# Patient Record
Sex: Female | Born: 1985
Health system: Southern US, Community
[De-identification: ages and names within clinical notes are randomized; demographics above are authoritative.]

## PROBLEM LIST (undated history)

## (undated) DIAGNOSIS — F32A Depression, unspecified: Secondary | ICD-10-CM

## (undated) DIAGNOSIS — K5900 Constipation, unspecified: Secondary | ICD-10-CM

## (undated) DIAGNOSIS — F419 Anxiety disorder, unspecified: Secondary | ICD-10-CM

## (undated) DIAGNOSIS — F329 Major depressive disorder, single episode, unspecified: Secondary | ICD-10-CM

## (undated) HISTORY — DX: Depression, unspecified: F32.A

## (undated) HISTORY — DX: Anxiety disorder, unspecified: F41.9

## (undated) HISTORY — DX: Major depressive disorder, single episode, unspecified: F32.9

---
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
## 2015-03-18 ENCOUNTER — Ambulatory Visit: Payer: Self-pay | Admitting: Nurse Practitioner

## 2015-03-24 ENCOUNTER — Encounter: Payer: Self-pay | Admitting: Nurse Practitioner

## 2015-03-24 ENCOUNTER — Encounter (INDEPENDENT_AMBULATORY_CARE_PROVIDER_SITE_OTHER): Payer: Self-pay

## 2015-03-24 ENCOUNTER — Ambulatory Visit (INDEPENDENT_AMBULATORY_CARE_PROVIDER_SITE_OTHER): Payer: BLUE CROSS/BLUE SHIELD | Admitting: Nurse Practitioner

## 2015-03-24 VITALS — BP 118/78 | HR 95 | Temp 98.5°F | Resp 14 | Ht 65.0 in | Wt 132.0 lb

## 2015-03-24 DIAGNOSIS — Z7189 Other specified counseling: Secondary | ICD-10-CM | POA: Diagnosis not present

## 2015-03-24 DIAGNOSIS — F411 Generalized anxiety disorder: Secondary | ICD-10-CM

## 2015-03-24 DIAGNOSIS — Z7251 High risk heterosexual behavior: Secondary | ICD-10-CM

## 2015-03-24 DIAGNOSIS — Z7689 Persons encountering health services in other specified circumstances: Secondary | ICD-10-CM | POA: Insufficient documentation

## 2015-03-24 MED ORDER — BUSPIRONE HCL 7.5 MG PO TABS: 7.5000 mg | ORAL_TABLET | Freq: Three times a day (TID) | ORAL | Status: DC

## 2015-03-24 NOTE — Assessment & Plan Note (Signed)
Discussed acute and chronic issues. Reviewed health maintenance measures, PFSHx, and immunizations. Obtain records from previous facility.   

## 2015-03-24 NOTE — Patient Instructions (Addendum)
Welcome to [facility]!  Generalized Anxiety Disorder Generalized anxiety disorder (GAD) is a mental disorder. It interferes with life functions, including relationships, work, and school. GAD is different from normal anxiety, which everyone experiences at some point in their lives in response to specific life events and activities. Normal anxiety actually helps [place] prepare for and get through these life events and activities. Normal anxiety goes away after the event or activity is over.  GAD causes anxiety that is not necessarily related to specific events or activities. It also causes excess anxiety in proportion to specific events or activities. The anxiety associated with GAD is also difficult to control. GAD can vary from mild to severe. People with severe GAD can have intense waves of anxiety with physical symptoms (panic attacks).  SYMPTOMS The anxiety and worry associated with GAD are difficult to control. This anxiety and worry are related to many life events and activities and also occur more days than not for 6 months or longer. People with GAD also have three or more of the following symptoms (one or more in children):  Restlessness.   Fatigue.  Difficulty concentrating.   Irritability.  Muscle tension.  Difficulty sleeping or unsatisfying sleep. DIAGNOSIS GAD is diagnosed through an assessment by your health care provider. Your health care provider will ask you questions aboutyour mood,physical symptoms, and events in your life. Your health care provider may ask you about your medical history and use of alcohol or drugs, including prescription medicines. Your health care provider may also do a physical exam and blood tests. Certain medical conditions and the use of certain substances can cause symptoms similar to those associated with GAD. Your health care provider may refer you to a mental health specialist for further evaluation. TREATMENT The following therapies are usually used  to treat GAD:   Medication. Antidepressant medication usually is prescribed for long-term daily control. Antianxiety medicines may be added in severe cases, especially when panic attacks occur.   Talk therapy (psychotherapy). Certain types of talk therapy can be helpful in treating GAD by providing support, education, and guidance. A form of talk therapy called cognitive behavioral therapy can teach you healthy ways to think about and react to daily life events and activities.  Stress managementtechniques. These include yoga, meditation, and exercise and can be very helpful when they are practiced regularly. A mental health specialist can help determine which treatment is best for you. Some people see improvement with one therapy. However, other people require a combination of therapies. Document Released: 11/19/2012 Document Revised: 12/09/2013 Document Reviewed: 11/19/2012 [facility] Patient Information 2015 [place], [place]. This information is not intended to replace advice given to you by your health care provider. Make sure you discuss any questions you have with your health care provider.

## 2015-03-24 NOTE — Progress Notes (Signed)
Patient ID: Stephanie Williams, female    DOB: 12-04-1985  Age: 29 y.o. MRN: [id number]  CC: Establish Care   HPI Stephanie Williams presents for establishing care and CC of anxiety.   1) New pt info:   Pap- 2014   Eye Exam- 07/2014   LMP- End of July approx.   2) Chronic Problems-  Anxiety and depression- see acute  3) Acute Problems-  Anxiety- overwhelming feelings, cluttered mind, tachycardia. Patient reports she is unable to turn her mind off at nighttime. She reports she has had an affair that is unknown to her husband. She would like to have STD testing today.    History Stephanie Williams has a past medical history of Anxiety and Depression.   She has past surgical history that includes Wisdom tooth extraction (2001).   Her family history includes Cancer in her maternal grandfather and paternal grandfather; Hypertension in her father and maternal grandmother.She reports that she has never smoked. She has never used smokeless tobacco. She reports that she drinks about 1.8 oz of alcohol per week. She reports that she does not use illicit drugs.  No outpatient prescriptions prior to visit.   No facility-administered medications prior to visit.    ROS Review of Systems  Constitutional: Negative for fever, chills, diaphoresis and fatigue.  Respiratory: Negative for chest tightness, shortness of breath and wheezing.   Cardiovascular: Negative for chest pain, palpitations and leg swelling.  Gastrointestinal: Negative for nausea, vomiting and diarrhea.  Skin: Negative for rash.  Neurological: Negative for dizziness, weakness, numbness and headaches.  Psychiatric/Behavioral: The patient is nervous/anxious.     Objective:  BP 118/78 mmHg  Pulse 95  Temp(Src) 98.5 F (36.9 C)  Resp 14  Ht 5\' 5"  (1.651 m)  Wt 132 lb (59.875 kg)  BMI 21.97 kg/m2  SpO2 99%  Physical Exam  Constitutional: She is oriented to person, place, and time. She appears well-developed and well-nourished. No distress.  HENT:   Head: Normocephalic and atraumatic.  Right Ear: External ear normal.  Left Ear: External ear normal.  Cardiovascular: Normal rate and regular rhythm.   No murmur heard. Pulmonary/Chest: Effort normal and breath sounds normal. No respiratory distress. She has no wheezes. She has no rales. She exhibits no tenderness.  Genitourinary:  Deferred exam today due to lack of symptoms.  Neurological: She is alert and oriented to person, place, and time. No cranial nerve deficit. She exhibits normal muscle tone. Coordination normal.  Skin: Skin is warm and dry. No rash noted. She is not diaphoretic.  Psychiatric: She has a normal mood and affect. Her behavior is normal. Judgment and thought content normal.   Assessment & Plan:   Stephanie Williams was seen today for establish care.  Diagnoses and all orders for this visit:  Generalized anxiety disorder  Encounter to establish care  High risk sexual behavior -     Hepatitis C antibody -     STD Panel (HBSAG,HIV,RPR) -     GC/chlamydia probe amp, urine  Other orders -     busPIRone (BUSPAR) 7.5 MG tablet; Take 1 tablet (7.5 mg total) by mouth 3 (three) times daily.   I am having Stephanie Williams start on busPIRone. I am also having her maintain her citalopram.  Meds ordered this encounter  Medications  . citalopram (CELEXA) 40 MG tablet    Sig: Take 40 mg by mouth daily.  . busPIRone (BUSPAR) 7.5 MG tablet    Sig: Take 1 tablet (7.5 mg total) by mouth 3 (  three) times daily.    Dispense:  90 tablet    Refill:  0    Order Specific Question:  Supervising Provider    Answer:  Stephanie Williams [[id number]]     Follow-up: Return in about 4 weeks (around 04/21/2015) for Anxiety.

## 2015-03-25 LAB — GC/CHLAMYDIA PROBE AMP, URINE
Chlamydia, Swab/Urine, PCR: NEGATIVE
GC Probe Amp, Urine: NEGATIVE

## 2015-03-25 LAB — STD PANEL
HEP B S AG: NEGATIVE
HIV 1&2 Ab, 4th Generation: NONREACTIVE

## 2015-03-25 LAB — HEPATITIS C ANTIBODY: HCV AB: NEGATIVE

## 2015-04-01 ENCOUNTER — Encounter: Payer: Self-pay | Admitting: Nurse Practitioner

## 2015-04-01 DIAGNOSIS — Z7251 High risk heterosexual behavior: Secondary | ICD-10-CM | POA: Insufficient documentation

## 2015-04-01 NOTE — Assessment & Plan Note (Addendum)
Patient is currently on Celexa 40 mg daily. Patient willing to try BuSpar 7.5 mg twice a day for 1 week can up to 3 times a day on the second week and beyond. Patient has declined counseling. Follow-up in 4 weeks.

## 2015-04-01 NOTE — Assessment & Plan Note (Signed)
Patient reports having unprotected sex and would like to have STD testing today. Will obtain STD panel, hepatitis C antibody, gonorrhea and chlamydia urine.

## 2015-04-06 ENCOUNTER — Encounter: Payer: BLUE CROSS/BLUE SHIELD | Admitting: Unknown Physician Specialty

## 2015-04-22 ENCOUNTER — Encounter: Payer: Self-pay | Admitting: Nurse Practitioner

## 2015-04-22 ENCOUNTER — Ambulatory Visit (INDEPENDENT_AMBULATORY_CARE_PROVIDER_SITE_OTHER): Payer: BLUE CROSS/BLUE SHIELD | Admitting: Nurse Practitioner

## 2015-04-22 VITALS — BP 116/74 | HR 83 | Temp 99.0°F | Resp 14 | Ht 65.0 in | Wt 133.6 lb

## 2015-04-22 DIAGNOSIS — F411 Generalized anxiety disorder: Secondary | ICD-10-CM | POA: Diagnosis not present

## 2015-04-22 DIAGNOSIS — Z3041 Encounter for surveillance of contraceptive pills: Secondary | ICD-10-CM | POA: Insufficient documentation

## 2015-04-22 MED ORDER — NORGESTIMATE-ETH ESTRADIOL 0.25-35 MG-MCG PO TABS: 1.0000 | ORAL_TABLET | Freq: Every day | ORAL | Status: DC

## 2015-04-22 MED ORDER — CITALOPRAM HYDROBROMIDE 40 MG PO TABS: 40.0000 mg | ORAL_TABLET | Freq: Every day | ORAL | Status: DC

## 2015-04-22 NOTE — Progress Notes (Signed)
Patient ID: Stephanie Williams, female    DOB: 02-16-1986  Age: 29 y.o. MRN: [id number]  CC: Follow-up   HPI [name] presents for follow up of anxiety.   1) Celexa 40 mg daily and tried Buspar 7.5 mg  Improved . Buspar- helps when taking occasionally Patient reports she is currently in counseling with her husband to work on issues. Husband is very willing to move forward with her and she is pleased about this.  LMP- 04/07/2015, 5 days normal for pt   History [name] has a past medical history of Anxiety and Depression.   She has past surgical history that includes Wisdom tooth extraction (2001).   Her family history includes Cancer in her maternal grandfather and paternal grandfather; Hypertension in her father and maternal grandmother.She reports that she has never smoked. She has never used smokeless tobacco. She reports that she drinks about 1.8 oz of alcohol per week. She reports that she does not use illicit drugs.  Outpatient Prescriptions Prior to Visit  Medication Sig Dispense Refill  . busPIRone (BUSPAR) 7.5 MG tablet Take 1 tablet (7.5 mg total) by mouth 3 (three) times daily. 90 tablet 0  . citalopram (CELEXA) 40 MG tablet Take 40 mg by mouth daily.     No facility-administered medications prior to visit.    ROS Review of Systems  Constitutional: Negative for fever, chills, diaphoresis and fatigue.  Respiratory: Negative for chest tightness, shortness of breath and wheezing.   Gastrointestinal: Negative for nausea, vomiting and diarrhea.  Neurological: Negative for dizziness and headaches.  Psychiatric/Behavioral: The patient is nervous/anxious.        Improving    Objective:  BP 116/74 mmHg  Pulse 83  Temp(Src) 99 F (37.2 C)  Resp 14  Ht 5\' 5"  (1.651 m)  Wt 133 lb 9.6 oz (60.601 kg)  BMI 22.23 kg/m2  SpO2 99%  Physical Exam  Constitutional: She is oriented to person, place, and time. She appears well-developed and well-nourished. No distress.  HENT:  Head:  Normocephalic and atraumatic.  Right Ear: External ear normal.  Left Ear: External ear normal.  Neurological: She is alert and oriented to person, place, and time. No cranial nerve deficit. She exhibits normal muscle tone. Coordination normal.  Skin: Skin is warm and dry. No rash noted. She is not diaphoretic.  Psychiatric: She has a normal mood and affect. Her behavior is normal. Judgment and thought content normal.   Assessment & Plan:   Lorita was seen today for follow-up.  Diagnoses and all orders for this visit:  Generalized anxiety disorder  Encounter for birth control pills maintenance  Other orders -     norgestimate-ethinyl estradiol (ORTHO-CYCLEN,SPRINTEC,PREVIFEM) 0.25-35 MG-MCG tablet; Take 1 tablet by mouth daily. -     citalopram (CELEXA) 40 MG tablet; Take 1 tablet (40 mg total) by mouth daily.  I have changed [name]'s citalopram. I am also having her maintain her busPIRone and norgestimate-ethinyl estradiol.  Meds ordered this encounter  Medications  . DISCONTD: norgestimate-ethinyl estradiol (ORTHO-CYCLEN,SPRINTEC,PREVIFEM) 0.25-35 MG-MCG tablet    Sig: Take 1 tablet by mouth daily.  . norgestimate-ethinyl estradiol (ORTHO-CYCLEN,SPRINTEC,PREVIFEM) 0.25-35 MG-MCG tablet    Sig: Take 1 tablet by mouth daily.    Dispense:  3 Package    Refill:  3    Order Specific Question:  Supervising Provider    Answer:  Derrel Nip, TERESA L [[id number]]  . citalopram (CELEXA) 40 MG tablet    Sig: Take 1 tablet (40 mg total) by mouth daily.  Dispense:  90 tablet    Refill:  3    Order Specific Question:  Supervising Provider    Answer:  Crecencio Mc [[id number]]     Follow-up: Return in about 6 months (around 10/20/2015) for CPE.

## 2015-04-22 NOTE — Assessment & Plan Note (Signed)
Patient needed refills on birth control today. Last menstrual cycle was the beginning of this month. Patient has not been sexually active since menstrual cycle. We'll follow-up with Pap smear and physical exam in March 2017.

## 2015-04-22 NOTE — Assessment & Plan Note (Addendum)
Improving. Patient is doing well on the buspirone and Celexa combination will continue this regimen. Patient is working on marital counseling. Refilled Celexa for 1 year. Follow-up in 6 months  *Had discussion with patient regarding possible pregnancy in the future and as discussed Celexa being contraindicated during pregnancy. Patient verbalized agreement

## 2015-04-22 NOTE — Progress Notes (Signed)
Pre visit review using our clinic review tool, if applicable. No additional management support is needed unless otherwise documented below in the visit note. 

## 2015-04-22 NOTE — Patient Instructions (Addendum)
You're doing great! See you in 6 months for physical or sooner if needed for any illness.

## 2015-05-18 ENCOUNTER — Ambulatory Visit (INDEPENDENT_AMBULATORY_CARE_PROVIDER_SITE_OTHER): Payer: BLUE CROSS/BLUE SHIELD | Admitting: Nurse Practitioner

## 2015-05-18 ENCOUNTER — Encounter: Payer: Self-pay | Admitting: Nurse Practitioner

## 2015-05-18 VITALS — BP 122/84 | HR 89 | Temp 98.5°F | Resp 14 | Ht 65.0 in | Wt 135.0 lb

## 2015-05-18 DIAGNOSIS — R300 Dysuria: Secondary | ICD-10-CM | POA: Diagnosis not present

## 2015-05-18 LAB — POCT URINALYSIS DIPSTICK
BILIRUBIN UA: NEGATIVE
GLUCOSE UA: NEGATIVE
KETONES UA: NEGATIVE
Leukocytes, UA: NEGATIVE
Nitrite, UA: NEGATIVE
Protein, UA: NEGATIVE
SPEC GRAV UA: 1.01
UROBILINOGEN UA: 0.2
pH, UA: 7

## 2015-05-18 MED ORDER — CIPROFLOXACIN HCL 250 MG PO TABS: 250.0000 mg | ORAL_TABLET | Freq: Two times a day (BID) | ORAL | Status: DC

## 2015-05-18 NOTE — Progress Notes (Signed)
Patient ID: Stephanie Williams, female    DOB: Jun 25, 1986  Age: 29 y.o. MRN: [id number]  CC: Urinary Tract Infection   HPI [name] presents for UTI symptoms x 5-6 days.   1) Pt reports dysuria, low back pain for 5-6 days. Azo last night with some relief.  Small amount of blood seen a few times, lower abdominal pain.  LMP- 05/04/15 Normal for pt  Denies recent swimming or sexual intercourse.   History [name] has a past medical history of Anxiety and Depression.   She has past surgical history that includes Wisdom tooth extraction (2001).   Her family history includes Cancer in her maternal grandfather and paternal grandfather; Hypertension in her father and maternal grandmother.She reports that she has never smoked. She has never used smokeless tobacco. She reports that she drinks about 1.8 oz of alcohol per week. She reports that she does not use illicit drugs.  Outpatient Prescriptions Prior to Visit  Medication Sig Dispense Refill  . busPIRone (BUSPAR) 7.5 MG tablet Take 1 tablet (7.5 mg total) by mouth 3 (three) times daily. 90 tablet 0  . citalopram (CELEXA) 40 MG tablet Take 1 tablet (40 mg total) by mouth daily. 90 tablet 3  . norgestimate-ethinyl estradiol (ORTHO-CYCLEN,SPRINTEC,PREVIFEM) 0.25-35 MG-MCG tablet Take 1 tablet by mouth daily. 3 Package 3   No facility-administered medications prior to visit.    ROS Review of Systems  Constitutional: Negative for fever, chills, diaphoresis and fatigue.  Respiratory: Negative for chest tightness, shortness of breath and wheezing.   Cardiovascular: Negative for chest pain, palpitations and leg swelling.  Gastrointestinal: Positive for abdominal pain. Negative for nausea, vomiting and diarrhea.       Lower abdominal  Genitourinary: Positive for dysuria, urgency, frequency and flank pain. Negative for hematuria, decreased urine volume, difficulty urinating and pelvic pain.  Musculoskeletal: Positive for back pain.    Objective:  BP  122/84 mmHg  Pulse 89  Temp(Src) 98.5 F (36.9 C)  Resp 14  Ht 5\' 5"  (1.651 m)  Wt 135 lb (61.236 kg)  BMI 22.47 kg/m2  SpO2 99%  Physical Exam  Constitutional: She is oriented to person, place, and time. She appears well-developed and well-nourished. No distress.  HENT:  Head: Normocephalic and atraumatic.  Right Ear: External ear normal.  Left Ear: External ear normal.  Abdominal: There is no CVA tenderness.  Neurological: She is alert and oriented to person, place, and time.  Skin: Skin is warm and dry. No rash noted. She is not diaphoretic.  Psychiatric: She has a normal mood and affect. Her behavior is normal. Judgment and thought content normal.   Assessment & Plan:   Doree was seen today for urinary tract infection.  Diagnoses and all orders for this visit:  Dysuria -     POCT Urinalysis Dipstick -     Urine culture  Other orders -     ciprofloxacin (CIPRO) 250 MG tablet; Take 1 tablet (250 mg total) by mouth 2 (two) times daily.   I am having [name] start on ciprofloxacin. I am also having her maintain her busPIRone, norgestimate-ethinyl estradiol, and citalopram.  Meds ordered this encounter  Medications  . ciprofloxacin (CIPRO) 250 MG tablet    Sig: Take 1 tablet (250 mg total) by mouth 2 (two) times daily.    Dispense:  6 tablet    Refill:  0    Order Specific Question:  Supervising Provider    Answer:  Deborra Medina L [[id number]]  Follow-up: Return if symptoms worsen or fail to improve.

## 2015-05-18 NOTE — Patient Instructions (Signed)
Drink water with Lemon or Lime   Yogurt and/or probiotics with Cipro (twice daily for 3 days) Urine Culture results- Will send via MyChart.

## 2015-05-18 NOTE — Assessment & Plan Note (Signed)
POCT urine is unsure for infection. Will treat due to length and progression of symptoms with lysed blood in urine. Cipro 250 mg x 3 days twice daily. Will obtain urine culture and follow.

## 2015-05-18 NOTE — Progress Notes (Signed)
Pre visit review using our clinic review tool, if applicable. No additional management support is needed unless otherwise documented below in the visit note. 

## 2015-05-20 LAB — URINE CULTURE

## 2015-05-26 ENCOUNTER — Other Ambulatory Visit: Payer: Self-pay | Admitting: Unknown Physician Specialty

## 2015-08-25 ENCOUNTER — Encounter: Payer: Self-pay | Admitting: Nurse Practitioner

## 2015-10-21 ENCOUNTER — Encounter: Payer: BLUE CROSS/BLUE SHIELD | Admitting: Nurse Practitioner

## 2015-10-29 ENCOUNTER — Ambulatory Visit (INDEPENDENT_AMBULATORY_CARE_PROVIDER_SITE_OTHER): Payer: BLUE CROSS/BLUE SHIELD | Admitting: Nurse Practitioner

## 2015-10-29 ENCOUNTER — Other Ambulatory Visit (HOSPITAL_COMMUNITY)
Admission: RE | Admit: 2015-10-29 | Discharge: 2015-10-29 | Disposition: A | Discharge status: Home / Self Care | Payer: BLUE CROSS/BLUE SHIELD | Source: Ambulatory Visit | Attending: Nurse Practitioner | Admitting: Nurse Practitioner

## 2015-10-29 ENCOUNTER — Encounter: Payer: Self-pay | Admitting: Nurse Practitioner

## 2015-10-29 VITALS — BP 112/74 | HR 98 | Temp 98.8°F | Resp 14 | Ht 65.0 in | Wt 137.8 lb

## 2015-10-29 DIAGNOSIS — Z23 Encounter for immunization: Secondary | ICD-10-CM | POA: Diagnosis not present

## 2015-10-29 DIAGNOSIS — Z01419 Encounter for gynecological examination (general) (routine) without abnormal findings: Secondary | ICD-10-CM

## 2015-10-29 DIAGNOSIS — Z1151 Encounter for screening for human papillomavirus (HPV): Secondary | ICD-10-CM | POA: Diagnosis not present

## 2015-10-29 DIAGNOSIS — Z0000 Encounter for general adult medical examination without abnormal findings: Secondary | ICD-10-CM

## 2015-10-29 NOTE — Patient Instructions (Signed)
Health Maintenance, Female Adopting a healthy lifestyle and getting preventive care can go a long way to promote health and wellness. Talk with your health care provider about what schedule of regular examinations is right for you. This is a good chance for you to check in with your provider about disease prevention and staying healthy. In between checkups, there are plenty of things you can do on your own. Experts have done a lot of research about which lifestyle changes and preventive measures are most likely to keep you healthy. Ask your health care provider for more information. WEIGHT AND DIET  Eat a healthy diet  Be sure to include plenty of vegetables, fruits, low-fat dairy products, and lean protein.  Do not eat a lot of foods high in solid fats, added sugars, or salt.  Get regular exercise. This is one of the most important things you can do for your health.  Most adults should exercise for at least 150 minutes each week. The exercise should increase your heart rate and make you sweat (moderate-intensity exercise).  Most adults should also do strengthening exercises at least twice a week. This is in addition to the moderate-intensity exercise.  Maintain a healthy weight  Body mass index (BMI) is a measurement that can be used to identify possible weight problems. It estimates body fat based on height and weight. Your health care provider can help determine your BMI and help you achieve or maintain a healthy weight.  For females 20 years of age and older:   A BMI below 18.5 is considered underweight.  A BMI of 18.5 to 24.9 is normal.  A BMI of 25 to 29.9 is considered overweight.  A BMI of 30 and above is considered obese.  Watch levels of cholesterol and blood lipids  You should start having your blood tested for lipids and cholesterol at 32 years of age, then have this test every 5 years.  You may need to have your cholesterol levels checked more often if:  Your lipid  or cholesterol levels are high.  You are older than 32 years of age.  You are at high risk for heart disease.  CANCER SCREENING   Lung Cancer  Lung cancer screening is recommended for adults 55-80 years old who are at high risk for lung cancer because of a history of smoking.  A yearly low-dose CT scan of the lungs is recommended for people who:  Currently smoke.  Have quit within the past 15 years.  Have at least a 30-pack-year history of smoking. A pack year is smoking an average of one pack of cigarettes a day for 1 year.  Yearly screening should continue until it has been 15 years since you quit.  Yearly screening should stop if you develop a health problem that would prevent you from having lung cancer treatment.  Breast Cancer  Practice breast self-awareness. This means understanding how your breasts normally appear and feel.  It also means doing regular breast self-exams. Let your health care provider know about any changes, no matter how small.  If you are in your 20s or 30s, you should have a clinical breast exam (CBE) by a health care provider every 1-3 years as part of a regular health exam.  If you are 40 or older, have a CBE every year. Also consider having a breast X-ray (mammogram) every year.  If you have a family history of breast cancer, talk to your health care provider about genetic screening.  If you   are at high risk for breast cancer, talk to your health care provider about having an MRI and a mammogram every year.  Breast cancer gene (BRCA) assessment is recommended for women who have family members with BRCA-related cancers. BRCA-related cancers include:  Breast.  Ovarian.  Tubal.  Peritoneal cancers.  Results of the assessment will determine the need for genetic counseling and BRCA1 and BRCA2 testing. Cervical Cancer Your health care provider may recommend that you be screened regularly for cancer of the pelvic organs (ovaries, uterus, and  vagina). This screening involves a pelvic examination, including checking for microscopic changes to the surface of your cervix (Pap test). You may be encouraged to have this screening done every 3 years, beginning at age 21.  For women ages 30-65, health care providers may recommend pelvic exams and Pap testing every 3 years, or they may recommend the Pap and pelvic exam, combined with testing for human papilloma virus (HPV), every 5 years. Some types of HPV increase your risk of cervical cancer. Testing for HPV may also be done on women of any age with unclear Pap test results.  Other health care providers may not recommend any screening for nonpregnant women who are considered low risk for pelvic cancer and who do not have symptoms. Ask your health care provider if a screening pelvic exam is right for you.  If you have had past treatment for cervical cancer or a condition that could lead to cancer, you need Pap tests and screening for cancer for at least 20 years after your treatment. If Pap tests have been discontinued, your risk factors (such as having a new sexual partner) need to be reassessed to determine if screening should resume. Some women have medical problems that increase the chance of getting cervical cancer. In these cases, your health care provider may recommend more frequent screening and Pap tests. Colorectal Cancer  This type of cancer can be detected and often prevented.  Routine colorectal cancer screening usually begins at 32 years of age and continues through 32 years of age.  Your health care provider may recommend screening at an earlier age if you have risk factors for colon cancer.  Your health care provider may also recommend using home test kits to check for hidden blood in the stool.  A small camera at the end of a tube can be used to examine your colon directly (sigmoidoscopy or colonoscopy). This is done to check for the earliest forms of colorectal  cancer.  Routine screening usually begins at age 50.  Direct examination of the colon should be repeated every 5-10 years through 32 years of age. However, you may need to be screened more often if early forms of precancerous polyps or small growths are found. Skin Cancer  Check your skin from head to toe regularly.  Tell your health care provider about any new moles or changes in moles, especially if there is a change in a mole's shape or color.  Also tell your health care provider if you have a mole that is larger than the size of a pencil eraser.  Always use sunscreen. Apply sunscreen liberally and repeatedly throughout the day.  Protect yourself by wearing long sleeves, pants, a wide-brimmed hat, and sunglasses whenever you are outside. HEART DISEASE, DIABETES, AND HIGH BLOOD PRESSURE   High blood pressure causes heart disease and increases the risk of stroke. High blood pressure is more likely to develop in:  People who have blood pressure in the high end   of the normal range (130-139/85-89 mm Hg).  People who are overweight or obese.  People who are African American.  If you are 38-23 years of age, have your blood pressure checked every 3-5 years. If you are 61 years of age or older, have your blood pressure checked every year. You should have your blood pressure measured twice--once when you are at a hospital or clinic, and once when you are not at a hospital or clinic. Record the average of the two measurements. To check your blood pressure when you are not at a hospital or clinic, you can use:  An automated blood pressure machine at a pharmacy.  A home blood pressure monitor.  If you are between 45 years and 39 years old, ask your health care provider if you should take aspirin to prevent strokes.  Have regular diabetes screenings. This involves taking a blood sample to check your fasting blood sugar level.  If you are at a normal weight and have a low risk for diabetes,  have this test once every three years after 32 years of age.  If you are overweight and have a high risk for diabetes, consider being tested at a younger age or more often. PREVENTING INFECTION  Hepatitis B  If you have a higher risk for hepatitis B, you should be screened for this virus. You are considered at high risk for hepatitis B if:  You were born in a country where hepatitis B is common. Ask your health care provider which countries are considered high risk.  Your parents were born in a high-risk country, and you have not been immunized against hepatitis B (hepatitis B vaccine).  You have HIV or AIDS.  You use needles to inject street drugs.  You live with someone who has hepatitis B.  You have had sex with someone who has hepatitis B.  You get hemodialysis treatment.  You take certain medicines for conditions, including cancer, organ transplantation, and autoimmune conditions. Hepatitis C  Blood testing is recommended for:  Everyone born from [birth year] through 1967.  Anyone with known risk factors for hepatitis C. Sexually transmitted infections (STIs)  You should be screened for sexually transmitted infections (STIs) including gonorrhea and chlamydia if:  You are sexually active and are younger than 32 years of age.  You are older than 32 years of age and your health care provider tells you that you are at risk for this type of infection.  Your sexual activity has changed since you were last screened and you are at an increased risk for chlamydia or gonorrhea. Ask your health care provider if you are at risk.  If you do not have HIV, but are at risk, it may be recommended that you take a prescription medicine daily to prevent HIV infection. This is called pre-exposure prophylaxis (PrEP). You are considered at risk if:  You are sexually active and do not regularly use condoms or know the HIV status of your partner(s).  You take drugs by injection.  You are sexually  active with a partner who has HIV. Talk with your health care provider about whether you are at high risk of being infected with HIV. If you choose to begin PrEP, you should first be tested for HIV. You should then be tested every 3 months for as long as you are taking PrEP.  PREGNANCY   If you are premenopausal and you may become pregnant, ask your health care provider about preconception counseling.  If you may  become pregnant, take 400 to 800 micrograms (mcg) of folic acid every day.  If you want to prevent pregnancy, talk to your health care provider about birth control (contraception). OSTEOPOROSIS AND MENOPAUSE   Osteoporosis is a disease in which the bones lose minerals and strength with aging. This can result in serious bone fractures. Your risk for osteoporosis can be identified using a bone density scan.  If you are 61 years of age or older, or if you are at risk for osteoporosis and fractures, ask your health care provider if you should be screened.  Ask your health care provider whether you should take a calcium or vitamin D supplement to lower your risk for osteoporosis.  Menopause may have certain physical symptoms and risks.  Hormone replacement therapy may reduce some of these symptoms and risks. Talk to your health care provider about whether hormone replacement therapy is right for you.  HOME CARE INSTRUCTIONS   Schedule regular health, dental, and eye exams.  Stay current with your immunizations.   Do not use any tobacco products including cigarettes, chewing tobacco, or electronic cigarettes.  If you are pregnant, do not drink alcohol.  If you are breastfeeding, limit how much and how often you drink alcohol.  Limit alcohol intake to no more than 1 drink per day for nonpregnant women. One drink equals 12 ounces of beer, 5 ounces of wine, or 1 ounces of hard liquor.  Do not use street drugs.  Do not share needles.  Ask your health care provider for help if  you need support or information about quitting drugs.  Tell your health care provider if you often feel depressed.  Tell your health care provider if you have ever been abused or do not feel safe at home.   This information is not intended to replace advice given to you by your health care provider. Make sure you discuss any questions you have with your health care provider.   Document Released: 02/07/2011 Document Revised: 08/15/2014 Document Reviewed: 06/26/2013 Elsevier Interactive Patient Education [employer].

## 2015-10-29 NOTE — Progress Notes (Signed)
Patient ID: Stephanie Williams, female    DOB: Dec 27, 1985  Age: 30 y.o. MRN: [id number]  CC: Annual Exam   HPI Stephanie Williams presents for Annual Exam + PAP.   1) Annual Physical   Diet- Healthy Diet   Exercise- 2-3 days a week, dancing- DVD    Immunizations-    Flu- No    Tdap- needs today  Eye Exam- UTD  Dental Exam- UTD  LMP- 10/18/15 lasted 5 days, spotting between periods   Labs-  Needs a lab appointment   Fall- Neg.   Depression- Neg.  Refills: Denies need for   Seeing a therapist- helpful    History [name] has a past medical history of Anxiety and Depression.   She has past surgical history that includes Wisdom tooth extraction (2001).   Her family history includes Cancer in her maternal grandfather and paternal grandfather; Hypertension in her father and maternal grandmother.She reports that she has never smoked. She has never used smokeless tobacco. She reports that she drinks about 1.8 oz of alcohol per week. She reports that she does not use illicit drugs.  Outpatient Prescriptions Prior to Visit  Medication Sig Dispense Refill  . busPIRone (BUSPAR) 7.5 MG tablet Take 1 tablet (7.5 mg total) by mouth 3 (three) times daily. 90 tablet 0  . citalopram (CELEXA) 40 MG tablet TAKE 1 TABLET DAILY 90 tablet 3  . norgestimate-ethinyl estradiol (ORTHO-CYCLEN,SPRINTEC,PREVIFEM) 0.25-35 MG-MCG tablet Take 1 tablet by mouth daily. 3 Package 3  . ciprofloxacin (CIPRO) 250 MG tablet Take 1 tablet (250 mg total) by mouth 2 (two) times daily. 6 tablet 0   No facility-administered medications prior to visit.    ROS Review of Systems  Constitutional: Negative for fever, chills, diaphoresis, fatigue and unexpected weight change.  HENT: Negative for tinnitus and trouble swallowing.   Eyes: Negative for visual disturbance.  Respiratory: Negative for chest tightness, shortness of breath and wheezing.   Cardiovascular: Negative for chest pain, palpitations and leg swelling.  Gastrointestinal:  Negative for nausea, vomiting, abdominal pain, diarrhea, constipation, blood in stool and rectal pain.  Endocrine: Negative for polydipsia, polyphagia and polyuria.  Genitourinary: Negative for dysuria, hematuria, vaginal discharge and vaginal pain.  Musculoskeletal: Negative for myalgias, back pain, arthralgias and gait problem.  Skin: Negative for color change and rash.  Neurological: Negative for dizziness, weakness, numbness and headaches.  Hematological: Does not bruise/bleed easily.  Psychiatric/Behavioral: Negative for suicidal ideas and sleep disturbance. The patient is not nervous/anxious.     Objective:  BP 112/74 mmHg  Pulse 98  Temp(Src) 98.8 F (37.1 C) (Oral)  Resp 14  Ht 5\' 5"  (1.651 m)  Wt 137 lb 12.8 oz (62.506 kg)  BMI 22.93 kg/m2  SpO2 99%  LMP 10/18/2015  Physical Exam  Constitutional: She is oriented to person, place, and time. She appears well-developed and well-nourished. No distress.  HENT:  Head: Normocephalic and atraumatic.  Right Ear: External ear normal.  Left Ear: External ear normal.  Nose: Nose normal.  Mouth/Throat: Oropharynx is clear and moist. No oropharyngeal exudate.  TMs and canals clear bilaterally  Eyes: Conjunctivae and EOM are normal. Pupils are equal, round, and reactive to light. Right eye exhibits no discharge. Left eye exhibits no discharge. No scleral icterus.  Neck: Normal range of motion. Neck supple. No thyromegaly present.  Cardiovascular: Normal rate, regular rhythm, normal heart sounds and intact distal pulses.  Exam reveals no gallop and no friction rub.   No murmur heard. Pulmonary/Chest: Effort normal and breath  sounds normal. No respiratory distress. She has no wheezes. She has no rales. She exhibits no tenderness.  Breasts are dense bilaterally, no significant findings  Abdominal: Soft. Bowel sounds are normal. She exhibits no distension and no mass. There is no tenderness. There is no rebound and no guarding.   Genitourinary: Vagina normal and uterus normal. No vaginal discharge found.  Musculoskeletal: Normal range of motion. She exhibits no edema or tenderness.  Lymphadenopathy:    She has no cervical adenopathy.  Neurological: She is alert and oriented to person, place, and time. She has normal reflexes. No cranial nerve deficit. She exhibits normal muscle tone. Coordination normal.  Skin: Skin is warm and dry. No rash noted. She is not diaphoretic. No erythema. No pallor.  Psychiatric: She has a normal mood and affect. Her behavior is normal. Judgment and thought content normal.   Assessment & Plan:   Euda was seen today for annual exam.  Diagnoses and all orders for this visit:  Encounter for gynecological examination -     Cytology - PAP  Routine general medical examination at a health care facility -     CBC with Differential/Platelet; Future -     Comprehensive metabolic panel; Future -     Hemoglobin A1c; Future -     Lipid panel; Future -     TSH; Future  Need for Tdap vaccination -     Tdap vaccine greater than or equal to 7yo IM   I have discontinued [name]'s ciprofloxacin. I am also having her maintain her busPIRone, norgestimate-ethinyl estradiol, and citalopram.  No orders of the defined types were placed in this encounter.     Follow-up: Return in about 1 year (around 10/28/2016) for CPE w/ labs.

## 2015-11-01 DIAGNOSIS — Z0000 Encounter for general adult medical examination without abnormal findings: Secondary | ICD-10-CM | POA: Insufficient documentation

## 2015-11-01 DIAGNOSIS — Z23 Encounter for immunization: Secondary | ICD-10-CM | POA: Insufficient documentation

## 2015-11-01 DIAGNOSIS — Z01419 Encounter for gynecological examination (general) (routine) without abnormal findings: Secondary | ICD-10-CM | POA: Insufficient documentation

## 2015-11-01 NOTE — Assessment & Plan Note (Signed)
Discussed acute and chronic issues. Reviewed health maintenance measures, PFSHx, and immunizations. Obtain routine labs TSH, Lipid panel, CBC w/ diff, A1c, and CMET.   Tdap given today Updated HM measures PAP today

## 2015-11-01 NOTE — Assessment & Plan Note (Signed)
PAP and breast exam today No significant findings

## 2015-11-03 LAB — CYTOLOGY - PAP

## 2015-11-04 ENCOUNTER — Other Ambulatory Visit (INDEPENDENT_AMBULATORY_CARE_PROVIDER_SITE_OTHER): Payer: BLUE CROSS/BLUE SHIELD

## 2015-11-04 DIAGNOSIS — Z0000 Encounter for general adult medical examination without abnormal findings: Secondary | ICD-10-CM | POA: Diagnosis not present

## 2015-11-04 LAB — COMPREHENSIVE METABOLIC PANEL
ALBUMIN: 4.5 g/dL (ref 3.5–5.2)
ALK PHOS: 40 U/L (ref 39–117)
ALT: 10 U/L (ref 0–35)
AST: 13 U/L (ref 0–37)
BUN: 15 mg/dL (ref 6–23)
CHLORIDE: 104 meq/L (ref 96–112)
CO2: 28 mEq/L (ref 19–32)
Calcium: 9.4 mg/dL (ref 8.4–10.5)
Creatinine, Ser: 0.7 mg/dL (ref 0.40–1.20)
GFR: 103.1 mL/min (ref >60.00)
Glucose, Bld: 88 mg/dL (ref 70–99)
POTASSIUM: 4.3 meq/L (ref 3.5–5.1)
Sodium: 137 mEq/L (ref 135–145)
TOTAL PROTEIN: 7.3 g/dL (ref 6.0–8.3)
Total Bilirubin: 0.5 mg/dL (ref 0.2–1.2)

## 2015-11-04 LAB — CBC WITH DIFFERENTIAL/PLATELET
BASOS PCT: 0.6 % (ref 0.0–3.0)
Basophils Absolute: 0 10*3/uL (ref 0.0–0.1)
EOS ABS: 0.1 10*3/uL (ref 0.0–0.7)
Eosinophils Relative: 1.7 % (ref 0.0–5.0)
HCT: 38.4 % (ref 36.0–46.0)
HEMOGLOBIN: 13.3 g/dL (ref 12.0–15.0)
Lymphocytes Relative: 34.2 % (ref 12.0–46.0)
Lymphs Abs: 1.1 10*3/uL (ref 0.7–4.0)
MCHC: 34.5 g/dL (ref 30.0–36.0)
MCV: 92.5 fl (ref 78.0–100.0)
MONO ABS: 0.3 10*3/uL (ref 0.1–1.0)
Monocytes Relative: 8.5 % (ref 3.0–12.0)
NEUTROS ABS: 1.8 10*3/uL (ref 1.4–7.7)
Neutrophils Relative %: 55 % (ref 43.0–77.0)
Platelets: 282 10*3/uL (ref 150.0–400.0)
RBC: 4.15 Mil/uL (ref 3.87–5.11)
RDW: 12.6 % (ref 11.5–15.5)
WBC: 3.3 10*3/uL — ABNORMAL LOW (ref 4.0–10.5)

## 2015-11-04 LAB — LIPID PANEL
CHOLESTEROL: 178 mg/dL (ref 0–200)
HDL: 70 mg/dL (ref >39.00)
LDL Cholesterol: 97 mg/dL (ref 0–99)
NonHDL: 107.88
TRIGLYCERIDES: 52 mg/dL (ref 0.0–149.0)
Total CHOL/HDL Ratio: 3
VLDL: 10.4 mg/dL (ref 0.0–40.0)

## 2015-11-04 LAB — HEMOGLOBIN A1C: HEMOGLOBIN A1C: 5 % (ref 4.6–6.5)

## 2015-11-04 LAB — TSH: TSH: 2.31 u[IU]/mL (ref 0.35–4.50)

## 2016-01-14 ENCOUNTER — Other Ambulatory Visit: Payer: Self-pay | Admitting: Unknown Physician Specialty

## 2016-07-25 ENCOUNTER — Other Ambulatory Visit: Payer: Self-pay | Admitting: *Deleted

## 2016-07-25 NOTE — Telephone Encounter (Signed)
Last office visit 10/29/15 Lorane Gell, NP Next office visit 11/02/16 with Mable Paris, FNP Never rx'd by you

## 2016-07-25 NOTE — Telephone Encounter (Signed)
Patient has requested a medication refill for preview  Pharmacy [facility] on [address] [facility]

## 2016-07-26 ENCOUNTER — Encounter: Payer: Self-pay | Admitting: Family

## 2016-07-26 MED ORDER — NORGESTIMATE-ETH ESTRADIOL 0.25-35 MG-MCG PO TABS: 1.0000 | ORAL_TABLET | Freq: Every day | ORAL | 3 refills | Status: DC

## 2016-10-04 DIAGNOSIS — Z1283 Encounter for screening for malignant neoplasm of skin: Secondary | ICD-10-CM | POA: Diagnosis not present

## 2016-10-04 DIAGNOSIS — L814 Other melanin hyperpigmentation: Secondary | ICD-10-CM | POA: Diagnosis not present

## 2016-10-04 DIAGNOSIS — D229 Melanocytic nevi, unspecified: Secondary | ICD-10-CM | POA: Diagnosis not present

## 2016-10-31 ENCOUNTER — Encounter: Payer: BLUE CROSS/BLUE SHIELD | Admitting: Nurse Practitioner

## 2016-11-02 ENCOUNTER — Encounter: Payer: BLUE CROSS/BLUE SHIELD | Admitting: Family

## 2016-12-07 ENCOUNTER — Encounter: Payer: Self-pay | Admitting: Family

## 2016-12-07 ENCOUNTER — Ambulatory Visit (INDEPENDENT_AMBULATORY_CARE_PROVIDER_SITE_OTHER): Payer: 59 | Admitting: Family

## 2016-12-07 ENCOUNTER — Other Ambulatory Visit (HOSPITAL_COMMUNITY)
Admission: RE | Admit: 2016-12-07 | Discharge: 2016-12-07 | Disposition: A | Discharge status: Home / Self Care | Payer: 59 | Source: Ambulatory Visit | Attending: Family | Admitting: Family

## 2016-12-07 VITALS — BP 134/84 | HR 103 | Temp 98.5°F | Ht 65.0 in | Wt 142.6 lb

## 2016-12-07 DIAGNOSIS — Z7251 High risk heterosexual behavior: Secondary | ICD-10-CM | POA: Insufficient documentation

## 2016-12-07 DIAGNOSIS — Z0000 Encounter for general adult medical examination without abnormal findings: Secondary | ICD-10-CM | POA: Diagnosis not present

## 2016-12-07 DIAGNOSIS — R102 Pelvic and perineal pain: Secondary | ICD-10-CM

## 2016-12-07 DIAGNOSIS — M545 Low back pain: Secondary | ICD-10-CM | POA: Diagnosis not present

## 2016-12-07 DIAGNOSIS — G8929 Other chronic pain: Secondary | ICD-10-CM

## 2016-12-07 DIAGNOSIS — R1024 Suprapubic pain: Secondary | ICD-10-CM | POA: Insufficient documentation

## 2016-12-07 DIAGNOSIS — F411 Generalized anxiety disorder: Secondary | ICD-10-CM | POA: Diagnosis not present

## 2016-12-07 LAB — URINALYSIS, MICROSCOPIC ONLY: RBC / HPF: NONE SEEN (ref 0–?)

## 2016-12-07 LAB — POCT URINALYSIS DIPSTICK
BILIRUBIN UA: NEGATIVE
GLUCOSE UA: NEGATIVE
KETONES UA: NEGATIVE
LEUKOCYTES UA: NEGATIVE
Nitrite, UA: NEGATIVE
PROTEIN UA: NEGATIVE
Spec Grav, UA: <=1.005 — AB (ref 1.010–1.025)
Urobilinogen, UA: 0.2 E.U./dL
pH, UA: 6 (ref 5.0–8.0)

## 2016-12-07 LAB — POCT URINE PREGNANCY: Preg Test, Ur: NEGATIVE

## 2016-12-07 MED ORDER — CITALOPRAM HYDROBROMIDE 40 MG PO TABS: 40.0000 mg | ORAL_TABLET | Freq: Every day | ORAL | 4 refills | Status: DC

## 2016-12-07 NOTE — Patient Instructions (Addendum)
Labs when fasting - please make an appointment  As discussed, low back or pelvic pain is nonspecific at this time; very happy with your exam today.   Please let me know if pain persists or worsens in any way.   Pleasure meeting you     Health Maintenance, Female Adopting a healthy lifestyle and getting preventive care can go a long way to promote health and wellness. Talk with your health care provider about what schedule of regular examinations is right for you. This is a good chance for you to check in with your provider about disease prevention and staying healthy. In between checkups, there are plenty of things you can do on your own. Experts have done a lot of research about which lifestyle changes and preventive measures are most likely to keep you healthy. Ask your health care provider for more information. Weight and diet Eat a healthy diet  Be sure to include plenty of vegetables, fruits, low-fat dairy products, and lean protein.  Do not eat a lot of foods high in solid fats, added sugars, or salt.  Get regular exercise. This is one of the most important things you can do for your health.  Most adults should exercise for at least 150 minutes each week. The exercise should increase your heart rate and make you sweat (moderate-intensity exercise).  Most adults should also do strengthening exercises at least twice a week. This is in addition to the moderate-intensity exercise. Maintain a healthy weight  Body mass index (BMI) is a measurement that can be used to identify possible weight problems. It estimates body fat based on height and weight. Your health care provider can help determine your BMI and help you achieve or maintain a healthy weight.  For females 61 years of age and older:  A BMI below 18.5 is considered underweight.  A BMI of 18.5 to 24.9 is normal.  A BMI of 25 to 29.9 is considered overweight.  A BMI of 30 and above is considered obese. Watch levels of  cholesterol and blood lipids  You should start having your blood tested for lipids and cholesterol at 33 years of age, then have this test every 5 years.  You may need to have your cholesterol levels checked more often if:  Your lipid or cholesterol levels are high.  You are older than 33 years of age.  You are at high risk for heart disease. Cancer screening Lung Cancer  Lung cancer screening is recommended for adults 47-14 years old who are at high risk for lung cancer because of a history of smoking.  A yearly low-dose CT scan of the lungs is recommended for people who:  Currently smoke.  Have quit within the past 15 years.  Have at least a 30-pack-year history of smoking. A pack year is smoking an average of one pack of cigarettes a day for 1 year.  Yearly screening should continue until it has been 15 years since you quit.  Yearly screening should stop if you develop a health problem that would prevent you from having lung cancer treatment. Breast Cancer  Practice breast self-awareness. This means understanding how your breasts normally appear and feel.  It also means doing regular breast self-exams. Let your health care provider know about any changes, no matter how small.  If you are in your 20s or 30s, you should have a clinical breast exam (CBE) by a health care provider every 1-3 years as part of a regular health exam.  If you are 40 or older, have a CBE every year. Also consider having a breast X-ray (mammogram) every year.  If you have a family history of breast cancer, talk to your health care provider about genetic screening.  If you are at high risk for breast cancer, talk to your health care provider about having an MRI and a mammogram every year.  Breast cancer gene (BRCA) assessment is recommended for women who have family members with BRCA-related cancers. BRCA-related cancers include:  Breast.  Ovarian.  Tubal.  Peritoneal cancers.  Results of  the assessment will determine the need for genetic counseling and BRCA1 and BRCA2 testing. Cervical Cancer  Your health care provider may recommend that you be screened regularly for cancer of the pelvic organs (ovaries, uterus, and vagina). This screening involves a pelvic examination, including checking for microscopic changes to the surface of your cervix (Pap test). You may be encouraged to have this screening done every 3 years, beginning at age 20.  For women ages 77-65, health care providers may recommend pelvic exams and Pap testing every 3 years, or they may recommend the Pap and pelvic exam, combined with testing for human papilloma virus (HPV), every 5 years. Some types of HPV increase your risk of cervical cancer. Testing for HPV may also be done on women of any age with unclear Pap test results.  Other health care providers may not recommend any screening for nonpregnant women who are considered low risk for pelvic cancer and who do not have symptoms. Ask your health care provider if a screening pelvic exam is right for you.  If you have had past treatment for cervical cancer or a condition that could lead to cancer, you need Pap tests and screening for cancer for at least 20 years after your treatment. If Pap tests have been discontinued, your risk factors (such as having a new sexual partner) need to be reassessed to determine if screening should resume. Some women have medical problems that increase the chance of getting cervical cancer. In these cases, your health care provider may recommend more frequent screening and Pap tests. Colorectal Cancer  This type of cancer can be detected and often prevented.  Routine colorectal cancer screening usually begins at 33 years of age and continues through 33 years of age.  Your health care provider may recommend screening at an earlier age if you have risk factors for colon cancer.  Your health care provider may also recommend using home test  kits to check for hidden blood in the stool.  A small camera at the end of a tube can be used to examine your colon directly (sigmoidoscopy or colonoscopy). This is done to check for the earliest forms of colorectal cancer.  Routine screening usually begins at age 45.  Direct examination of the colon should be repeated every 5-10 years through 33 years of age. However, you may need to be screened more often if early forms of precancerous polyps or small growths are found. Skin Cancer  Check your skin from head to toe regularly.  Tell your health care provider about any new moles or changes in moles, especially if there is a change in a mole's shape or color.  Also tell your health care provider if you have a mole that is larger than the size of a pencil eraser.  Always use sunscreen. Apply sunscreen liberally and repeatedly throughout the day.  Protect yourself by wearing long sleeves, pants, a wide-brimmed hat, and sunglasses whenever  you are outside. Heart disease, diabetes, and high blood pressure  High blood pressure causes heart disease and increases the risk of stroke. High blood pressure is more likely to develop in:  People who have blood pressure in the high end of the normal range (130-139/85-89 mm Hg).  People who are overweight or obese.  People who are African American.  If you are 54-31 years of age, have your blood pressure checked every 3-5 years. If you are 36 years of age or older, have your blood pressure checked every year. You should have your blood pressure measured twice-once when you are at a hospital or clinic, and once when you are not at a hospital or clinic. Record the average of the two measurements. To check your blood pressure when you are not at a hospital or clinic, you can use:  An automated blood pressure machine at a pharmacy.  A home blood pressure monitor.  If you are between 17 years and 56 years old, ask your health care provider if you should  take aspirin to prevent strokes.  Have regular diabetes screenings. This involves taking a blood sample to check your fasting blood sugar level.  If you are at a normal weight and have a low risk for diabetes, have this test once every three years after 33 years of age.  If you are overweight and have a high risk for diabetes, consider being tested at a younger age or more often. Preventing infection Hepatitis B  If you have a higher risk for hepatitis B, you should be screened for this virus. You are considered at high risk for hepatitis B if:  You were born in a country where hepatitis B is common. Ask your health care provider which countries are considered high risk.  Your parents were born in a high-risk country, and you have not been immunized against hepatitis B (hepatitis B vaccine).  You have HIV or AIDS.  You use needles to inject street drugs.  You live with someone who has hepatitis B.  You have had sex with someone who has hepatitis B.  You get hemodialysis treatment.  You take certain medicines for conditions, including cancer, organ transplantation, and autoimmune conditions. Hepatitis C  Blood testing is recommended for:  Everyone born from [birth year] through 1965.  Anyone with known risk factors for hepatitis C. Sexually transmitted infections (STIs)  You should be screened for sexually transmitted infections (STIs) including gonorrhea and chlamydia if:  You are sexually active and are younger than 33 years of age.  You are older than 33 years of age and your health care provider tells you that you are at risk for this type of infection.  Your sexual activity has changed since you were last screened and you are at an increased risk for chlamydia or gonorrhea. Ask your health care provider if you are at risk.  If you do not have HIV, but are at risk, it may be recommended that you take a prescription medicine daily to prevent HIV infection. This is called  pre-exposure prophylaxis (PrEP). You are considered at risk if:  You are sexually active and do not regularly use condoms or know the HIV status of your partner(s).  You take drugs by injection.  You are sexually active with a partner who has HIV. Talk with your health care provider about whether you are at high risk of being infected with HIV. If you choose to begin PrEP, you should first be tested for HIV.  You should then be tested every 3 months for as long as you are taking PrEP. Pregnancy  If you are premenopausal and you may become pregnant, ask your health care provider about preconception counseling.  If you may become pregnant, take 400 to 800 micrograms (mcg) of folic acid every day.  If you want to prevent pregnancy, talk to your health care provider about birth control (contraception). Osteoporosis and menopause  Osteoporosis is a disease in which the bones lose minerals and strength with aging. This can result in serious bone fractures. Your risk for osteoporosis can be identified using a bone density scan.  If you are 31 years of age or older, or if you are at risk for osteoporosis and fractures, ask your health care provider if you should be screened.  Ask your health care provider whether you should take a calcium or vitamin D supplement to lower your risk for osteoporosis.  Menopause may have certain physical symptoms and risks.  Hormone replacement therapy may reduce some of these symptoms and risks. Talk to your health care provider about whether hormone replacement therapy is right for you. Follow these instructions at home:  Schedule regular health, dental, and eye exams.  Stay current with your immunizations.  Do not use any tobacco products including cigarettes, chewing tobacco, or electronic cigarettes.  If you are pregnant, do not drink alcohol.  If you are breastfeeding, limit how much and how often you drink alcohol.  Limit alcohol intake to no more  than 1 drink per day for nonpregnant women. One drink equals 12 ounces of beer, 5 ounces of wine, or 1 ounces of hard liquor.  Do not use street drugs.  Do not share needles.  Ask your health care provider for help if you need support or information about quitting drugs.  Tell your health care provider if you often feel depressed.  Tell your health care provider if you have ever been abused or do not feel safe at home. This information is not intended to replace advice given to you by your health care provider. Make sure you discuss any questions you have with your health care provider. Document Released: 02/07/2011 Document Revised: 12/31/2015 Document Reviewed: 04/28/2015 Elsevier Interactive Patient Education  2017 [employer].

## 2016-12-07 NOTE — Assessment & Plan Note (Signed)
cbe and pelvic performed. Reassured by benign pelvic exam and absence of pain. Pap utd. Immunizations utd. Encouraged exercise.

## 2016-12-07 NOTE — Assessment & Plan Note (Signed)
Pleased to hear does well on celexa. Refilled for years supply as requested.

## 2016-12-07 NOTE — Progress Notes (Signed)
Subjective:    Patient ID: Stephanie Williams, female    DOB: Feb 16, 1986, 31 y.o.   MRN: [id number]  CC: Stephanie Williams is a 31 y.o. female who presents today for physical exam.    HPI: Would like STD testing 'full panel'. No h/o STDs. Has been having low back and suprapubic pain x 2 weeks ago-- 'has a history of that'. Describes as 'ache.' 'Not bad.' No triggers. Has seen chiropractor in the past. No pain today.   No changes in discharge, dyspareunia, dysuria, urinary frequency, hematuria, flank pain.   LMP: 11/14/2016.   GAD- has run out celexa and has felt more anxious. Likes being on celexa. Feels like worries more about health. Would like year supply.    OCP-No history of DVT, migraine with aura. No history of cancer. Patient states she's not pregnant or breast-feeding.       Colorectal Cancer Screening: No early family history Breast Cancer Screening: no early family history Cervical Cancer Screening: UTD 2017, neg malignancy and hpv Bone Health screening/DEXA for 65+: No increased fracture risk. Defer screening at this time. Lung Cancer Screening: Doesn't have 30 year pack year history and age > 9 years.       Tetanus - UTD        Labs: Screening labs today. Exercise: Gets regular exercise.  Alcohol use: occasional Smoking/tobacco use: Nonsmoker.  Wears seat belt: Yes. Skin: follows with dermatology annually  HISTORY:  Past Medical History:  Diagnosis Date  . Anxiety   . Depression     Past Surgical History:  Procedure Laterality Date  . WISDOM TOOTH EXTRACTION  2001   Family History  Problem Relation Age of Onset  . Hypertension Father   . Hypertension Maternal Grandmother   . Cancer Maternal Grandfather     Prostate Cancer  . Cancer Paternal Grandfather 62    Colon Cancer  . Breast cancer Neg Hx       ALLERGIES: Patient has no known allergies.  Current Outpatient Prescriptions on File Prior to Visit  Medication Sig Dispense Refill  .  norgestimate-ethinyl estradiol (PREVIFEM) 0.25-35 MG-MCG tablet Take 1 tablet by mouth daily. 84 tablet 3   No current facility-administered medications on file prior to visit.     Social History  Substance Use Topics  . Smoking status: Never Smoker  . Smokeless tobacco: Never Used  . Alcohol use 1.8 oz/week    3 Glasses of wine per week    Review of Systems  Constitutional: Negative for chills, fever and unexpected weight change.  HENT: Negative for congestion.   Respiratory: Negative for cough.   Cardiovascular: Negative for chest pain, palpitations and leg swelling.  Gastrointestinal: Negative for abdominal distention, abdominal pain, constipation, nausea and vomiting.  Genitourinary: Negative for difficulty urinating, dysuria, flank pain, frequency and hematuria.  Musculoskeletal: Positive for back pain. Negative for arthralgias and myalgias.  Skin: Negative for rash.  Neurological: Negative for weakness, numbness and headaches.  Hematological: Negative for adenopathy.  Psychiatric/Behavioral: Negative for confusion.      Objective:    BP 134/84   Pulse (!) 103   Temp 98.5 F (36.9 C) (Oral)   Ht 5\' 5"  (1.651 m)   Wt 142 lb 9.6 oz (64.7 kg)   LMP 11/14/2016   SpO2 99%   BMI 23.73 kg/m   BP Readings from Last 3 Encounters:  12/07/16 134/84  10/29/15 112/74  05/18/15 122/84   Wt Readings from Last 3 Encounters:  12/07/16 142 lb 9.6 oz (  64.7 kg)  10/29/15 137 lb 12.8 oz (62.5 kg)  05/18/15 135 lb (61.2 kg)    Physical Exam  Constitutional: She appears well-developed and well-nourished.  Eyes: Conjunctivae are normal.  Neck: No thyroid mass and no thyromegaly present.  Cardiovascular: Normal rate, regular rhythm, normal heart sounds and normal pulses.   Pulmonary/Chest: Effort normal and breath sounds normal. She has no wheezes. She has no rhonchi. She has no rales. Right breast exhibits no inverted nipple, no mass, no nipple discharge, no skin change and no  tenderness. Left breast exhibits no inverted nipple, no mass, no nipple discharge, no skin change and no tenderness. Breasts are symmetrical.  CBE performed.   Abdominal: Soft. Normal appearance and bowel sounds are normal. She exhibits no distension, no fluid wave, no ascites and no mass. There is no tenderness. There is no rigidity, no rebound, no guarding and no CVA tenderness.  Genitourinary: Uterus is not enlarged, not fixed and not tender. Cervix exhibits no motion tenderness, no discharge and no friability. Right adnexum displays no mass, no tenderness and no fullness. Left adnexum displays no mass, no tenderness and no fullness.  Genitourinary Comments:  No CMT. Unable to appreciated ovaries. No tenderness with bimanual exam.   Musculoskeletal:       Lumbar back: She exhibits normal range of motion, no tenderness, no bony tenderness, no swelling, no edema, no pain and no spasm.  Full range of motion with flexion, tension, lateral side bends. No bony tenderness. No pain, numbness, tingling elicited with single leg raise bilaterally.   Lymphadenopathy:       Head (right side): No submental, no submandibular, no tonsillar, no preauricular, no posterior auricular and no occipital adenopathy present.       Head (left side): No submental, no submandibular, no tonsillar, no preauricular, no posterior auricular and no occipital adenopathy present.    She has no cervical adenopathy.       Right cervical: No superficial cervical, no deep cervical and no posterior cervical adenopathy present.      Left cervical: No superficial cervical, no deep cervical and no posterior cervical adenopathy present.    She has no axillary adenopathy.       Right axillary: No pectoral and no lateral adenopathy present.       Left axillary: No pectoral and no lateral adenopathy present. Neurological: She is alert. She has normal strength. No sensory deficit.  Reflex Scores:      Patellar reflexes are 2+ on the right  side and 2+ on the left side. Sensation and strength intact bilateral lower extremities.  Skin: Skin is warm and dry.  Psychiatric: She has a normal mood and affect. Her speech is normal and behavior is normal. Thought content normal.  Vitals reviewed.      Assessment & Plan:   Problem List Items Addressed This Visit      Other   Generalized anxiety disorder    Pleased to hear does well on celexa. Refilled for years supply as requested.       Relevant Medications   citalopram (CELEXA) 40 MG tablet   High risk sexual behavior    Pending STD panel. Clinically asymptomatic.       Relevant Orders   Acute Hep Panel & Hep B Surface Ab   Urine cytology ancillary only   HIV antibody   HSV(herpes smplx)abs-1+2(IgG+IgM)-bld   RPR   POCT urine pregnancy   Routine general medical examination at a health care facility - Primary  cbe and pelvic performed. Reassured by benign pelvic exam and absence of pain. Pap utd. Immunizations utd. Encouraged exercise.       Relevant Orders   CBC with Differential/Platelet   Comprehensive metabolic panel   Hemoglobin A1c   Lipid panel   TSH   VITAMIN D 25 Hydroxy (Vit-D Deficiency, Fractures)   Cervicovaginal ancillary only   Suprapubic pain    No pain today. Reassured by normal pelvic exam, back exam, and absence of symptoms to support PID. No prior h/o STDs. Discussed at length her pain being nonspecific at this point and she assured me should let me know if recurs or new symptoms present.           I have discontinued [name]'s busPIRone. I have also changed her citalopram. Additionally, I am having her maintain her norgestimate-ethinyl estradiol.   Meds ordered this encounter  Medications  . citalopram (CELEXA) 40 MG tablet    Sig: Take 1 tablet (40 mg total) by mouth daily.    Dispense:  90 tablet    Refill:  4    Return precautions given.   Risks, benefits, and alternatives of the medications and treatment plan  prescribed today were discussed, and patient expressed understanding.   Education regarding symptom management and diagnosis given to patient on AVS.   Continue to follow with Mable Paris, FNP for routine health maintenance.   Stephanie Williams and I agreed with plan.   Mable Paris, FNP

## 2016-12-07 NOTE — Assessment & Plan Note (Signed)
No pain today. Reassured by normal pelvic exam, back exam, and absence of symptoms to support PID. No prior h/o STDs. Discussed at length her pain being nonspecific at this point and she assured me should let me know if recurs or new symptoms present.

## 2016-12-07 NOTE — Progress Notes (Signed)
Pre visit review using our clinic review tool, if applicable. No additional management support is needed unless otherwise documented below in the visit note. 

## 2016-12-07 NOTE — Assessment & Plan Note (Signed)
Pending STD panel. Clinically asymptomatic.

## 2016-12-08 ENCOUNTER — Other Ambulatory Visit (INDEPENDENT_AMBULATORY_CARE_PROVIDER_SITE_OTHER): Payer: 59

## 2016-12-08 ENCOUNTER — Other Ambulatory Visit: Payer: Self-pay | Admitting: Family

## 2016-12-08 DIAGNOSIS — Z0000 Encounter for general adult medical examination without abnormal findings: Secondary | ICD-10-CM

## 2016-12-08 DIAGNOSIS — Z7251 High risk heterosexual behavior: Secondary | ICD-10-CM

## 2016-12-08 DIAGNOSIS — B9689 Other specified bacterial agents as the cause of diseases classified elsewhere: Secondary | ICD-10-CM

## 2016-12-08 DIAGNOSIS — N760 Acute vaginitis: Principal | ICD-10-CM

## 2016-12-08 LAB — CBC WITH DIFFERENTIAL/PLATELET
BASOS PCT: 0.6 % (ref 0.0–3.0)
Basophils Absolute: 0 10*3/uL (ref 0.0–0.1)
EOS PCT: 1 % (ref 0.0–5.0)
Eosinophils Absolute: 0 10*3/uL (ref 0.0–0.7)
HEMATOCRIT: 41.1 % (ref 36.0–46.0)
Hemoglobin: 13.9 g/dL (ref 12.0–15.0)
LYMPHS ABS: 0.8 10*3/uL (ref 0.7–4.0)
LYMPHS PCT: 17.1 % (ref 12.0–46.0)
MCHC: 33.8 g/dL (ref 30.0–36.0)
MCV: 94.4 fl (ref 78.0–100.0)
MONOS PCT: 4.5 % (ref 3.0–12.0)
Monocytes Absolute: 0.2 10*3/uL (ref 0.1–1.0)
NEUTROS ABS: 3.8 10*3/uL (ref 1.4–7.7)
NEUTROS PCT: 76.8 % (ref 43.0–77.0)
PLATELETS: 303 10*3/uL (ref 150.0–400.0)
RBC: 4.36 Mil/uL (ref 3.87–5.11)
RDW: 12.5 % (ref 11.5–15.5)
WBC: 4.9 10*3/uL (ref 4.0–10.5)

## 2016-12-08 LAB — LIPID PANEL
CHOL/HDL RATIO: 2
CHOLESTEROL: 182 mg/dL (ref 0–200)
HDL: 78.2 mg/dL (ref >39.00)
LDL CALC: 86 mg/dL (ref 0–99)
NonHDL: 103.43
TRIGLYCERIDES: 85 mg/dL (ref 0.0–149.0)
VLDL: 17 mg/dL (ref 0.0–40.0)

## 2016-12-08 LAB — COMPREHENSIVE METABOLIC PANEL
ALT: 8 U/L (ref 0–35)
AST: 10 U/L (ref 0–37)
Albumin: 4.4 g/dL (ref 3.5–5.2)
Alkaline Phosphatase: 34 U/L — ABNORMAL LOW (ref 39–117)
BILIRUBIN TOTAL: 0.5 mg/dL (ref 0.2–1.2)
BUN: 10 mg/dL (ref 6–23)
CALCIUM: 9.2 mg/dL (ref 8.4–10.5)
CO2: 24 meq/L (ref 19–32)
Chloride: 104 mEq/L (ref 96–112)
Creatinine, Ser: 0.78 mg/dL (ref 0.40–1.20)
GFR: 90.38 mL/min (ref >60.00)
GLUCOSE: 95 mg/dL (ref 70–99)
POTASSIUM: 3.7 meq/L (ref 3.5–5.1)
Sodium: 136 mEq/L (ref 135–145)
Total Protein: 7.1 g/dL (ref 6.0–8.3)

## 2016-12-08 LAB — CERVICOVAGINAL ANCILLARY ONLY: Wet Prep (BD Affirm): POSITIVE — AB

## 2016-12-08 LAB — VITAMIN D 25 HYDROXY (VIT D DEFICIENCY, FRACTURES): VITD: 29.32 ng/mL — AB (ref 30.00–100.00)

## 2016-12-08 LAB — TSH: TSH: 1.88 u[IU]/mL (ref 0.35–4.50)

## 2016-12-08 LAB — HEMOGLOBIN A1C: Hgb A1c MFr Bld: 4.9 % (ref 4.6–6.5)

## 2016-12-08 MED ORDER — METRONIDAZOLE 500 MG PO TABS: 500.0000 mg | ORAL_TABLET | Freq: Two times a day (BID) | ORAL | 0 refills | Status: DC

## 2016-12-09 ENCOUNTER — Encounter: Payer: Self-pay | Admitting: Family

## 2016-12-09 LAB — ACUTE HEP PANEL AND HEP B SURFACE AB
HCV AB: NEGATIVE
HEP A IGM: NONREACTIVE
HEP B C IGM: NONREACTIVE
HEP B S AB: POSITIVE — AB
HEP B S AG: NEGATIVE

## 2016-12-09 LAB — HIV ANTIBODY (ROUTINE TESTING W REFLEX): HIV 1&2 Ab, 4th Generation: NONREACTIVE

## 2016-12-09 LAB — RPR

## 2016-12-11 LAB — HSV 1/2 AB (IGM), IFA W/RFLX TITER
HSV 1 IGM SCREEN: NEGATIVE
HSV 2 IGM SCREEN: NEGATIVE

## 2016-12-19 ENCOUNTER — Other Ambulatory Visit: Payer: Self-pay | Admitting: Family

## 2016-12-19 DIAGNOSIS — Z202 Contact with and (suspected) exposure to infections with a predominantly sexual mode of transmission: Secondary | ICD-10-CM

## 2016-12-22 ENCOUNTER — Encounter: Payer: Self-pay | Admitting: Family

## 2016-12-22 ENCOUNTER — Ambulatory Visit (INDEPENDENT_AMBULATORY_CARE_PROVIDER_SITE_OTHER): Payer: 59 | Admitting: Family

## 2016-12-22 ENCOUNTER — Other Ambulatory Visit (HOSPITAL_COMMUNITY)
Admission: RE | Admit: 2016-12-22 | Discharge: 2016-12-22 | Disposition: A | Discharge status: Home / Self Care | Payer: 59 | Source: Ambulatory Visit | Attending: Family | Admitting: Family

## 2016-12-22 VITALS — BP 130/70 | HR 88 | Temp 99.1°F | Ht 65.0 in | Wt 140.4 lb

## 2016-12-22 DIAGNOSIS — F411 Generalized anxiety disorder: Secondary | ICD-10-CM | POA: Diagnosis not present

## 2016-12-22 DIAGNOSIS — R197 Diarrhea, unspecified: Secondary | ICD-10-CM

## 2016-12-22 DIAGNOSIS — Z202 Contact with and (suspected) exposure to infections with a predominantly sexual mode of transmission: Secondary | ICD-10-CM | POA: Insufficient documentation

## 2016-12-22 NOTE — Progress Notes (Signed)
Pre visit review using our clinic review tool, if applicable. No additional management support is needed unless otherwise documented below in the visit note. 

## 2016-12-22 NOTE — Assessment & Plan Note (Signed)
Afebrile. No abdominal pain today. Reassured by normal abdominal exam. Patient I jointly agreed "discomfort" appears to correlate with anxiety. We discussed working diagnoses including irritable bowel disease. I advised patient that is a diagnosis of exclusion and we would need to pursue colonoscopy in order in order to rule out alternative diagnoses including colitis. We jointly decided today she would trial Imodium continue to watch symptom. If worsens or continues, patient will let me know and we'll place referral for GI.

## 2016-12-22 NOTE — Patient Instructions (Signed)
OTC Imodium.  Lets give the celexa more time as discussed if diarrhea persists, we would pursue GI/colonscopy  Urine today

## 2016-12-22 NOTE — Assessment & Plan Note (Signed)
Worsening. Patient files for divorce one month. She been on Celexa 2 weeks and we jointly decided to give this medication more time to reach therapeutic benefits. Patient will let me know in the interim if she continues to have trouble sleeping or increased anxiety. We did discuss adjuvant medications including trazodone for sleep.

## 2016-12-22 NOTE — Progress Notes (Signed)
Subjective:    Patient ID: Stephanie Williams, female    DOB: 1986-05-06, 31 y.o.   MRN: [id number]  CC: Stephanie Williams is a 31 y.o. female who presents today for follow up.   HPI: CC: lower abdominal 'discomfort'  X one month, unchanged.   At the time of onset, she was off of celexa and father had a health scare.  Pain moves from suprapubic to upper abdomen. Describes as a cramp, 'discomfort' , 'not pain' Endorses diarrhea, non bloody, almost every morning . 'anxiety makes me go more'. States she's always had a history of constipation, and intermittent diarrhea. Abdominal discomfort is not related to meals.   Also complains of anxiety worsened by divorce; files next month. Year ago had panic attack. No recent panic attacks. Trouble staying asleep. No thoughts of hurting herself or anyone else.   Similar symptoms in past when she was not on celexa.   Back on celexa for 2 weeks, which helps. Has old rx of ativan and takes for anxiety.       HISTORY:  Past Medical History:  Diagnosis Date  . Anxiety   . Depression    Past Surgical History:  Procedure Laterality Date  . WISDOM TOOTH EXTRACTION  2001   Family History  Problem Relation Age of Onset  . Hypertension Father   . Hypertension Maternal Grandmother   . Cancer Maternal Grandfather        Prostate Cancer  . Cancer Paternal Grandfather 24       Colon Cancer  . Breast cancer Neg Hx     Allergies: Patient has no known allergies. Current Outpatient Prescriptions on File Prior to Visit  Medication Sig Dispense Refill  . citalopram (CELEXA) 40 MG tablet Take 1 tablet (40 mg total) by mouth daily. 90 tablet 4  . norgestimate-ethinyl estradiol (PREVIFEM) 0.25-35 MG-MCG tablet Take 1 tablet by mouth daily. 84 tablet 3   No current facility-administered medications on file prior to visit.     Social History  Substance Use Topics  . Smoking status: Never Smoker  . Smokeless tobacco: Never Used  . Alcohol use 1.8 oz/week    3  Glasses of wine per week    Review of Systems  Constitutional: Negative for chills and fever.  Respiratory: Negative for cough.   Cardiovascular: Negative for chest pain and palpitations.  Gastrointestinal: Positive for constipation and diarrhea. Negative for abdominal distention, abdominal pain, nausea and vomiting.  Psychiatric/Behavioral: Positive for sleep disturbance. Negative for suicidal ideas. The patient is nervous/anxious.       Objective:    BP 130/70   Pulse 88   Temp 99.1 F (37.3 C) (Oral)   Ht 5\' 5"  (1.651 m)   Wt 140 lb 6.4 oz (63.7 kg)   LMP 12/12/2016 (Exact Date)   SpO2 97%   BMI 23.36 kg/m  BP Readings from Last 3 Encounters:  12/22/16 130/70  12/07/16 134/84  10/29/15 112/74   Wt Readings from Last 3 Encounters:  12/22/16 140 lb 6.4 oz (63.7 kg)  12/07/16 142 lb 9.6 oz (64.7 kg)  10/29/15 137 lb 12.8 oz (62.5 kg)    Physical Exam  Constitutional: She appears well-developed and well-nourished.  Eyes: Conjunctivae are normal.  Cardiovascular: Normal rate, regular rhythm, normal heart sounds and normal pulses.   Pulmonary/Chest: Effort normal and breath sounds normal. She has no wheezes. She has no rhonchi. She has no rales.  Abdominal: Soft. Normal appearance and bowel sounds are normal. She exhibits no  distension, no fluid wave, no ascites and no mass. There is no tenderness. There is no rigidity, no rebound, no guarding and no CVA tenderness.  Neurological: She is alert.  Skin: Skin is warm and dry.  Psychiatric: She has a normal mood and affect. Her speech is normal and behavior is normal. Thought content normal.  Vitals reviewed.      Assessment & Plan:   Problem List Items Addressed This Visit      Other   Generalized anxiety disorder - Primary    Worsening. Patient files for divorce one month. She been on Celexa 2 weeks and we jointly decided to give this medication more time to reach therapeutic benefits. Patient will let me know in the  interim if she continues to have trouble sleeping or increased anxiety. We did discuss adjuvant medications including trazodone for sleep.      STD exposure    Urine specimen for Chamlydia, gonorrhea was not drawn at last visit.  will collect today.      Diarrhea    Afebrile. No abdominal pain today. Reassured by normal abdominal exam. Patient I jointly agreed "discomfort" appears to correlate with anxiety. We discussed working diagnoses including irritable bowel disease. I advised patient that is a diagnosis of exclusion and we would need to pursue colonoscopy in order in order to rule out alternative diagnoses including colitis. We jointly decided today she would trial Imodium continue to watch symptom. If worsens or continues, patient will let me know and we'll place referral for GI.          I have discontinued [name]'s metroNIDAZOLE. I am also having her maintain her norgestimate-ethinyl estradiol and citalopram.   No orders of the defined types were placed in this encounter.   Return precautions given.   Risks, benefits, and alternatives of the medications and treatment plan prescribed today were discussed, and patient expressed understanding.   Education regarding symptom management and diagnosis given to patient on AVS.  Continue to follow with Burnard Hawthorne, FNP for routine health maintenance.   Stephanie Williams and I agreed with plan.   Mable Paris, FNP

## 2016-12-22 NOTE — Progress Notes (Signed)
Patient has been inform, patient stated she will be back today to redo UA

## 2016-12-22 NOTE — Assessment & Plan Note (Signed)
Urine specimen for Chamlydia, gonorrhea was not drawn at last visit.  will collect today.

## 2016-12-26 LAB — URINE CYTOLOGY ANCILLARY ONLY
CHLAMYDIA, DNA PROBE: NEGATIVE
NEISSERIA GONORRHEA: NEGATIVE
TRICH (WINDOWPATH): NEGATIVE

## 2016-12-28 ENCOUNTER — Encounter: Payer: Self-pay | Admitting: Family

## 2016-12-29 LAB — URINE CYTOLOGY ANCILLARY ONLY
BACTERIAL VAGINITIS: NEGATIVE
Candida vaginitis: NEGATIVE

## 2017-06-19 ENCOUNTER — Other Ambulatory Visit: Payer: Self-pay | Admitting: Family

## 2017-08-14 ENCOUNTER — Other Ambulatory Visit: Payer: Self-pay

## 2017-08-14 ENCOUNTER — Ambulatory Visit (INDEPENDENT_AMBULATORY_CARE_PROVIDER_SITE_OTHER): Payer: BLUE CROSS/BLUE SHIELD | Admitting: Physician Assistant

## 2017-08-14 ENCOUNTER — Encounter: Payer: Self-pay | Admitting: Physician Assistant

## 2017-08-14 VITALS — BP 134/68 | HR 100 | Temp 100.0°F | Resp 16 | Ht 65.0 in | Wt 153.0 lb

## 2017-08-14 DIAGNOSIS — N939 Abnormal uterine and vaginal bleeding, unspecified: Secondary | ICD-10-CM | POA: Diagnosis not present

## 2017-08-14 DIAGNOSIS — B9689 Other specified bacterial agents as the cause of diseases classified elsewhere: Secondary | ICD-10-CM | POA: Diagnosis not present

## 2017-08-14 DIAGNOSIS — N760 Acute vaginitis: Secondary | ICD-10-CM

## 2017-08-14 LAB — POCT WET + KOH PREP
Trich by wet prep: ABSENT
YEAST BY KOH: ABSENT
YEAST BY WET PREP: ABSENT

## 2017-08-14 LAB — POCT URINALYSIS DIP (MANUAL ENTRY)
BILIRUBIN UA: NEGATIVE mg/dL
Bilirubin, UA: NEGATIVE
Blood, UA: NEGATIVE
Glucose, UA: NEGATIVE mg/dL
LEUKOCYTES UA: NEGATIVE
Nitrite, UA: NEGATIVE
PH UA: 7 (ref 5.0–8.0)
PROTEIN UA: NEGATIVE mg/dL
Spec Grav, UA: 1.01 (ref 1.010–1.025)
Urobilinogen, UA: 0.2 E.U./dL

## 2017-08-14 LAB — POCT URINE PREGNANCY: PREG TEST UR: NEGATIVE

## 2017-08-14 MED ORDER — METRONIDAZOLE 500 MG PO TABS: 500.0000 mg | ORAL_TABLET | Freq: Two times a day (BID) | ORAL | 0 refills | Status: DC

## 2017-08-14 NOTE — Patient Instructions (Addendum)
Bacterial Vaginosis Bacterial vaginosis is an infection of the vagina. It happens when too many germs (bacteria) grow in the vagina. This infection puts you at risk for infections from sex (STIs). Treating this infection can lower your risk for some STIs. You should also treat this if you are pregnant. It can cause your baby to be born early. Follow these instructions at home: Medicines  Take over-the-counter and prescription medicines only as told by your doctor.  Take or use your antibiotic medicine as told by your doctor. Do not stop taking or using it even if you start to feel better. General instructions  If you your sexual partner is a woman, tell her that you have this infection. She needs to get treatment if she has symptoms. If you have a female partner, he does not need to be treated.  During treatment: ? Avoid sex. ? Do not douche. ? Avoid alcohol as told. ? Avoid breastfeeding as told.  Drink enough fluid to keep your pee (urine) clear or pale yellow.  Keep your vagina and butt (rectum) clean. ? Wash the area with warm water every day. ? Wipe from front to back after you use the toilet.  Keep all follow-up visits as told by your doctor. This is important. Preventing this condition  Do not douche.  Use only warm water to wash around your vagina.  Use protection when you have sex. This includes: ? Latex condoms. ? Dental dams.  Limit how many people you have sex with. It is best to only have sex with the same person (be monogamous).  Get tested for STIs. Have your partner get tested.  Wear underwear that is cotton or lined with cotton.  Avoid tight pants and pantyhose. This is most important in summer.  Do not use any products that have nicotine or tobacco in them. These include cigarettes and e-cigarettes. If you need help quitting, ask your doctor.  Do not use illegal drugs.  Limit how much alcohol you drink. Contact a doctor if:  Your symptoms do not get  better, even after you are treated.  You have more discharge or pain when you pee (urinate).  You have a fever.  You have pain in your belly (abdomen).  You have pain with sex.  Your bleed from your vagina between periods. Summary  This infection happens when too many germs (bacteria) grow in the vagina.  Treating this condition can lower your risk for some infections from sex (STIs).  You should also treat this if you are pregnant. It can cause early (premature) birth.  Do not stop taking or using your antibiotic medicine even if you start to feel better. This information is not intended to replace advice given to you by your health care provider. Make sure you discuss any questions you have with your health care provider. Document Released: 05/03/2008 Document Revised: 04/09/2016 Document Reviewed: 04/09/2016 Elsevier Interactive Patient Education  2017 [employer].     IF you received an x-ray today, you will receive an invoice from [facility]. Please contact [facility] at [phone] with questions or concerns regarding your invoice.   IF you received labwork today, you will receive an invoice from [place]. Please contact [facility] at [phone] with questions or concerns regarding your invoice.   Our billing staff will not be able to assist you with questions regarding bills from these companies.  You will be contacted with the lab results as soon as they are available. The fastest way to  get your results is to activate your My Chart account. Instructions are located on the last page of this paperwork. If you have not heard from [place] regarding the results in 2 weeks, please contact this office.

## 2017-08-14 NOTE — Progress Notes (Signed)
[facility] AT [place], [place] [address] [phone]  Date:  08/14/2017   Name:  Stephanie Williams   DOB:  1986/06/29   MRN:  [id number]  PCP:  Burnard Hawthorne, FNP    History of Present Illness:  Stephanie Williams is a 32 y.o. female patient who presents to PCP with  Chief Complaint  Patient presents with  . Vaginal Bleeding    spotting bt periods every month     She has spotting for several months moreso 6 months.  There is spotting, enough for a pantyliner.  Last for a few days.  It is brown and slight red.  She has no cramping associated with this.  She may think it is stress related, as this has been stressful over the last couple years.  She denies any abnormal vaginal discharge. Hematuria, frequency, or dysuria. She is sexually active with one without condoms.   shye does not miss her periods.   Her periods are generally not heavy but for 1 day.  Cramping initially, but not horrible cramping.  No history of fibroids.   No increased fatigue.  No change in her bowels, but does have struggle with constipation. No family history of thyroid disease.   She is taking her celexa every day.   She has changed her diet, but it has been more inclusive.   No calorie counting    Patient Active Problem List   Diagnosis Date Noted  . STD exposure 12/22/2016  . Diarrhea 12/22/2016  . Suprapubic pain 12/07/2016  . Routine general medical examination at a health care facility 11/01/2015  . Dysuria 05/18/2015  . Encounter for birth control pills maintenance 04/22/2015  . High risk sexual behavior 04/01/2015  . Generalized anxiety disorder 03/24/2015    Past Medical History:  Diagnosis Date  . Anxiety   . Depression     Past Surgical History:  Procedure Laterality Date  . WISDOM TOOTH EXTRACTION  2001    Social History   Tobacco Use  . Smoking status: Never Smoker  . Smokeless tobacco: Never Used  Substance Use Topics  . Alcohol use: Yes     Alcohol/week: 1.8 oz    Types: 3 Glasses of wine per week  . Drug use: No    Family History  Problem Relation Age of Onset  . Hypertension Father   . Hypertension Maternal Grandmother   . Cancer Maternal Grandfather        Prostate Cancer  . Cancer Paternal Grandfather 80       Colon Cancer  . Breast cancer Neg Hx     No Known Allergies  Medication list has been reviewed and updated.  Current Outpatient Medications on File Prior to Visit  Medication Sig Dispense Refill  . citalopram (CELEXA) 40 MG tablet Take 1 tablet (40 mg total) by mouth daily. 90 tablet 4  . Multiple Vitamins-Minerals (HAIR SKIN AND NAILS FORMULA PO) Take by mouth.    . Omega-3 1000 MG CAPS Take by mouth.    . SPRINTEC 28 0.25-35 MG-MCG tablet TAKE 1 TABLET BY MOUTH DAILY. 84 tablet 3   No current facility-administered medications on file prior to visit.     ROS ROS otherwise unremarkable unless listed above.  Physical Examination: BP 134/68   Pulse 100   Temp 100 F (37.8 C) (Oral)   Resp 16   Ht 5\' 5"  (1.651 m)   Wt 153 lb (69.4 kg)   LMP 07/24/2017  SpO2 100%   BMI 25.46 kg/m  Ideal Body Weight: Weight in (lb) to have BMI = 25: 149.9  Physical Exam  Constitutional: She is oriented to person, place, and time. She appears well-developed and well-nourished. No distress.  HENT:  Head: Normocephalic and atraumatic.  Right Ear: External ear normal.  Left Ear: External ear normal.  Eyes: Conjunctivae and EOM are normal. Pupils are equal, round, and reactive to light.  Cardiovascular: Normal rate and regular rhythm. Exam reveals no friction rub.  No murmur heard. Pulmonary/Chest: Effort normal. No respiratory distress.  Abdominal: Soft. Bowel sounds are normal. She exhibits no distension.  Genitourinary: Uterus normal. Pelvic exam was performed with patient supine. Cervix exhibits discharge and friability. Cervix exhibits no motion tenderness. Right adnexum displays no mass. Left adnexum  displays no mass. Vaginal discharge found.  Neurological: She is alert and oriented to person, place, and time.  Skin: She is not diaphoretic.  Psychiatric: She has a normal mood and affect. Her behavior is normal.    Results for orders placed or performed in visit on 08/14/17  POCT urine pregnancy  Result Value Ref Range   Preg Test, Ur Negative Negative  POCT urinalysis dipstick  Result Value Ref Range   Color, UA yellow yellow   Clarity, UA clear clear   Glucose, UA negative negative mg/dL   Bilirubin, UA negative negative   Ketones, POC UA negative negative mg/dL   Spec Grav, UA 1.010 1.010 - 1.025   Blood, UA negative negative   pH, UA 7.0 5.0 - 8.0   Protein Ur, POC negative negative mg/dL   Urobilinogen, UA 0.2 0.2 or 1.0 E.U./dL   Nitrite, UA Negative Negative   Leukocytes, UA Negative Negative  POCT Wet + KOH Prep  Result Value Ref Range   Yeast by KOH Absent Absent   Yeast by wet prep Absent Absent   WBC by wet prep None (A) Few   Clue Cells Wet Prep HPF POC Few (A) None   Trich by wet prep Absent Absent   Bacteria Wet Prep HPF POC Many (A) Few   Epithelial Cells By [employer] Pref ([facility]) Few None, Few, Too numerous to count   RBC,UR,HPF,POC Few (A) None RBC/hpf    Assessment and Plan: Stephanie Williams is a 32 y.o. female who is here today for cc of  Chief Complaint  Patient presents with  . Vaginal Bleeding    spotting bt periods every month  treating for bacterial vaginosis at this time.  It appears it may be the cervix with spotting and friable possibly secondary to sexual activity makes it more friable and then spotting insues... Labs pending.  Bacterial vaginosis - Plan: metroNIDAZOLE (FLAGYL) 500 MG tablet  Vaginal bleeding - Plan: POCT urine pregnancy, POCT urinalysis dipstick  Abnormal vaginal bleeding - Plan: POCT Wet + KOH Prep, Pap IG, CT/NG w/ reflex HPV when ASC-U, TSH, CBC  Ivar Drape, PA-C [facility] 1/7/20196:33 PM

## 2017-08-15 LAB — PAP IG, CT-NG, RFX HPV ASCU
CHLAMYDIA, NUC. ACID AMP: NEGATIVE
Gonococcus by Nucleic Acid Amp: NEGATIVE
PAP SMEAR COMMENT: 0

## 2017-08-15 LAB — TSH: TSH: 1.62 u[IU]/mL (ref 0.450–4.500)

## 2017-09-03 ENCOUNTER — Encounter: Payer: Self-pay | Admitting: Physician Assistant

## 2017-10-02 ENCOUNTER — Encounter: Payer: Self-pay | Admitting: Physician Assistant

## 2017-11-15 ENCOUNTER — Encounter: Payer: Self-pay | Admitting: Physician Assistant

## 2018-02-21 ENCOUNTER — Encounter: Payer: Self-pay | Admitting: Internal Medicine

## 2018-02-21 ENCOUNTER — Ambulatory Visit (INDEPENDENT_AMBULATORY_CARE_PROVIDER_SITE_OTHER): Payer: 59 | Admitting: Internal Medicine

## 2018-02-21 VITALS — BP 136/78 | HR 87 | Temp 99.2°F | Ht 65.0 in | Wt 158.0 lb

## 2018-02-21 DIAGNOSIS — K5900 Constipation, unspecified: Secondary | ICD-10-CM | POA: Diagnosis not present

## 2018-02-21 DIAGNOSIS — R102 Pelvic and perineal pain: Secondary | ICD-10-CM | POA: Diagnosis not present

## 2018-02-21 DIAGNOSIS — N939 Abnormal uterine and vaginal bleeding, unspecified: Secondary | ICD-10-CM | POA: Diagnosis not present

## 2018-02-21 DIAGNOSIS — N938 Other specified abnormal uterine and vaginal bleeding: Secondary | ICD-10-CM

## 2018-02-21 DIAGNOSIS — K649 Unspecified hemorrhoids: Secondary | ICD-10-CM

## 2018-02-21 NOTE — Patient Instructions (Addendum)
Try Miralax as needed laxative  Increase fiber Flax seed, chia seeds, metamucil/benefiber  Warm prune juice  More fruits/veggies  Tucks pads/Preparation H Colace stool softner 100 mg 2x per day stool soft  Hemorrhoids Hemorrhoids are swollen veins in and around the rectum or anus. There are two types of hemorrhoids:  Internal hemorrhoids. These occur in the veins that are just inside the rectum. They may poke through to the outside and become irritated and painful.  External hemorrhoids. These occur in the veins that are outside of the anus and can be felt as a painful swelling or hard lump near the anus.  Most hemorrhoids do not cause serious problems, and they can be managed with home treatments such as diet and lifestyle changes. If home treatments do not help your symptoms, procedures can be done to shrink or remove the hemorrhoids. What are the causes? This condition is caused by increased pressure in the anal area. This pressure may result from various things, including:  Constipation.  Straining to have a bowel movement.  Diarrhea.  Pregnancy.  Obesity.  Sitting for long periods of time.  Heavy lifting or other activity that causes you to strain.  Anal sex.  What are the signs or symptoms? Symptoms of this condition include:  Pain.  Anal itching or irritation.  Rectal bleeding.  Leakage of stool (feces).  Anal swelling.  One or more lumps around the anus.  How is this diagnosed? This condition can often be diagnosed through a visual exam. Other exams or tests may also be done, such as:  Examination of the rectal area with a gloved hand (digital rectal exam).  Examination of the anal canal using a small tube (anoscope).  A blood test, if you have lost a significant amount of blood.  A test to look inside the colon (sigmoidoscopy or colonoscopy).  How is this treated? This condition can usually be treated at home. However, various procedures may be  done if dietary changes, lifestyle changes, and other home treatments do not help your symptoms. These procedures can help make the hemorrhoids smaller or remove them completely. Some of these procedures involve surgery, and others do not. Common procedures include:  Rubber band ligation. Rubber bands are placed at the base of the hemorrhoids to cut off the blood supply to them.  Sclerotherapy. Medicine is injected into the hemorrhoids to shrink them.  Infrared coagulation. A type of light energy is used to get rid of the hemorrhoids.  Hemorrhoidectomy surgery. The hemorrhoids are surgically removed, and the veins that supply them are tied off.  Stapled hemorrhoidopexy surgery. A circular stapling device is used to remove the hemorrhoids and use staples to cut off the blood supply to them.  Follow these instructions at home: Eating and drinking  Eat foods that have a lot of fiber in them, such as whole grains, beans, nuts, fruits, and vegetables. Ask your health care provider about taking products that have added fiber (fiber supplements).  Drink enough fluid to keep your urine clear or pale yellow. Managing pain and swelling  Take warm sitz baths for 20 minutes, 3-4 times a day to ease pain and discomfort.  If directed, apply ice to the affected area. Using ice packs between sitz baths may be helpful. ? Put ice in a plastic bag. ? Place a towel between your skin and the bag. ? Leave the ice on for 20 minutes, 2-3 times a day. General instructions  Take over-the-counter and prescription medicines only as  told by your health care provider.  Use medicated creams or suppositories as told.  Exercise regularly.  Go to the bathroom when you have the urge to have a bowel movement. Do not wait.  Avoid straining to have bowel movements.  Keep the anal area dry and clean. Use wet toilet paper or moist towelettes after a bowel movement.  Do not sit on the toilet for long periods of time.  This increases blood pooling and pain. Contact a health care provider if:  You have increasing pain and swelling that are not controlled by treatment or medicine.  You have uncontrolled bleeding.  You have difficulty having a bowel movement, or you are unable to have a bowel movement.  You have pain or inflammation outside the area of the hemorrhoids. This information is not intended to replace advice given to you by your health care provider. Make sure you discuss any questions you have with your health care provider. Document Released: 07/22/2000 Document Revised: 12/23/2015 Document Reviewed: 04/08/2015 Elsevier Interactive Patient Education  2018 [employer].  Diet for Irritable Bowel Syndrome When you have irritable bowel syndrome (IBS), the foods you eat and your eating habits are very important. IBS may cause various symptoms, such as abdominal pain, constipation, or diarrhea. Choosing the right foods can help ease discomfort caused by these symptoms. Work with your health care provider and dietitian to find the best eating plan to help control your symptoms. What general guidelines do I need to follow?  Keep a food diary. This will help you identify foods that cause symptoms. Write down: ? What you eat and when. ? What symptoms you have. ? When symptoms occur in relation to your meals.  Avoid foods that cause symptoms. Talk with your dietitian about other ways to get the same nutrients that are in these foods.  Eat more foods that contain fiber. Take a fiber supplement if directed by your dietitian.  Eat your meals slowly, in a relaxed setting.  Aim to eat 5-6 small meals per day. Do not skip meals.  Drink enough fluids to keep your urine clear or pale yellow.  Ask your health care provider if you should take an over-the-counter probiotic during flare-ups to help restore healthy gut bacteria.  If you have cramping or diarrhea, try making your meals low in fat and high in  carbohydrates. Examples of carbohydrates are pasta, rice, whole grain breads and cereals, fruits, and vegetables.  If dairy products cause your symptoms to flare up, try eating less of them. You might be able to handle yogurt better than other dairy products because it contains bacteria that help with digestion. What foods are not recommended? The following are some foods and drinks that may worsen your symptoms:  Fatty foods, such as [place] fries.  Milk products, such as cheese or ice cream.  Chocolate.  Alcohol.  Products with caffeine, such as coffee.  Carbonated drinks, such as soda.  The items listed above may not be a complete list of foods and beverages to avoid. Contact your dietitian for more information. What foods are good sources of fiber? Your health care provider or dietitian may recommend that you eat more foods that contain fiber. Fiber can help reduce constipation and other IBS symptoms. Add foods with fiber to your diet a little at a time so that your body can get used to them. Too much fiber at once might cause gas and swelling of your abdomen. The following are some foods that are  good sources of fiber:  Apples.  Peaches.  Pears.  Berries.  Figs.  Broccoli (raw).  Cabbage.  Carrots.  Raw peas.  Kidney beans.  Lima beans.  Whole grain bread.  Whole grain cereal.  Where to find more information: [employer] for Functional Gastrointestinal Disorders: www.iffgd.[employer] of Diabetes and Digestive and Kidney Diseases: NetworkAffair.co.za.aspx This information is not intended to replace advice given to you by your health care provider. Make sure you discuss any questions you have with your health care provider. Document Released: 10/15/2003 Document Revised: 12/31/2015 Document Reviewed: 10/25/2013 Elsevier Interactive Patient Education  2018 [employer].  Constipation, Adult Constipation is when a person has fewer bowel movements in a week than normal, has difficulty having a bowel movement, or has stools that are dry, hard, or larger than normal. Constipation may be caused by an underlying condition. It may become worse with age if a person takes certain medicines and does not take in enough fluids. Follow these instructions at home: Eating and drinking   Eat foods that have a lot of fiber, such as fresh fruits and vegetables, whole grains, and beans.  Limit foods that are high in fat, low in fiber, or overly processed, such as french fries, hamburgers, cookies, candies, and soda.  Drink enough fluid to keep your urine clear or pale yellow. General instructions  Exercise regularly or as told by your health care provider.  Go to the restroom when you have the urge to go. Do not hold it in.  Take over-the-counter and prescription medicines only as told by your health care provider. These include any fiber supplements.  Practice pelvic floor retraining exercises, such as deep breathing while relaxing the lower abdomen and pelvic floor relaxation during bowel movements.  Watch your condition for any changes.  Keep all follow-up visits as told by your health care provider. This is important. Contact a health care provider if:  You have pain that gets worse.  You have a fever.  You do not have a bowel movement after 4 days.  You vomit.  You are not hungry.  You lose weight.  You are bleeding from the anus.  You have thin, pencil-like stools. Get help right away if:  You have a fever and your symptoms suddenly get worse.  You leak stool or have blood in your stool.  Your abdomen is bloated.  You have severe pain in your abdomen.  You feel dizzy or you faint. This information is not intended to replace advice given to you by your health care provider. Make sure you discuss any questions you have with your health  care provider. Document Released: 04/22/2004 Document Revised: 02/12/2016 Document Reviewed: 01/13/2016 Elsevier Interactive Patient Education  2018 [employer].  Abnormal Uterine Bleeding Abnormal uterine bleeding means bleeding more than usual from your uterus. It can include:  Bleeding between periods.  Bleeding after sex.  Bleeding that is heavier than normal.  Periods that last longer than usual.  Bleeding after you have stopped having your period (menopause).  There are many problems that may cause this. You should see a doctor for any kind of bleeding that is not normal. Treatment depends on the cause of the bleeding. Follow these instructions at home:  Watch your condition for any changes.  Do not use tampons, douche, or have sex, if your doctor tells you not to.  Change your pads often.  Get regular well-woman exams. Make sure they include a pelvic exam and cervical  cancer screening.  Keep all follow-up visits as told by your doctor. This is important. Contact a doctor if:  The bleeding lasts more than one week.  You feel dizzy at times.  You feel like you are going to throw up (nauseous).  You throw up. Get help right away if:  You pass out.  You have to change pads every hour.  You have belly (abdominal) pain.  You have a fever.  You get sweaty.  You get weak.  You passing large blood clots from your vagina. Summary  Abnormal uterine bleeding means bleeding more than usual from your uterus.  There are many problems that may cause this. You should see a doctor for any kind of bleeding that is not normal.  Treatment depends on the cause of the bleeding. This information is not intended to replace advice given to you by your health care provider. Make sure you discuss any questions you have with your health care provider. Document Released: 05/22/2009 Document Revised: 07/19/2016 Document Reviewed: 07/19/2016 Elsevier Interactive Patient  Education  2017 [employer].

## 2018-02-21 NOTE — Progress Notes (Signed)
Chief Complaint  Patient presents with  . Vaginal Bleeding   F/u  1. C/o pelvic pain and C/o vaginal bleeding/spotting in between cycles LMP stopped 1 week ago Friday/saturday and she started spotting Thursday and Saturday had heavy bleeding like a period. This has occurred x 1 year. She has h/o BV and thought it was that used boric acid last week. She does not think she is pregnant  2. Constipation chronic since kid and she thinks she has a hemorrhoid which is painful with defecation and also she notices blood in the toilet at times    Review of Systems  Constitutional: Negative for weight loss.  Gastrointestinal: Positive for blood in stool and constipation.  Genitourinary:       +pelvic pain and vaginal spotting   Psychiatric/Behavioral:       +stress with work    Past Medical History:  Diagnosis Date  . Anxiety   . Depression    Past Surgical History:  Procedure Laterality Date  . WISDOM TOOTH EXTRACTION  2001   Family History  Problem Relation Age of Onset  . Hypertension Father   . Hypertension Maternal Grandmother   . Cancer Maternal Grandfather        Prostate Cancer  . Cancer Paternal Grandfather 59       Colon Cancer  . Breast cancer Neg Hx    Social History   Socioeconomic History  . Marital status: Divorced    Spouse name: Not on file  . Number of children: Not on file  . Years of education: Not on file  . Highest education level: Not on file  Occupational History  . Not on file  Social Needs  . Financial resource strain: Not on file  . Food insecurity:    Worry: Not on file    Inability: Not on file  . Transportation needs:    Medical: Not on file    Non-medical: Not on file  Tobacco Use  . Smoking status: Never Smoker  . Smokeless tobacco: Never Used  Substance and Sexual Activity  . Alcohol use: Yes    Alcohol/week: 1.8 oz    Types: 3 Glasses of wine per week  . Drug use: No  . Sexual activity: Yes    Partners: Male  Lifestyle  . Physical  activity:    Days per week: Not on file    Minutes per session: Not on file  . Stress: Not on file  Relationships  . Social connections:    Talks on phone: Not on file    Gets together: Not on file    Attends religious service: Not on file    Active member of club or organization: Not on file    Attends meetings of clubs or organizations: Not on file    Relationship status: Not on file  . Intimate partner violence:    Fear of current or ex partner: Not on file    Emotionally abused: Not on file    Physically abused: Not on file    Forced sexual activity: Not on file  Other Topics Concern  . Not on file  Social History Narrative   Works at [employer]    divorced   No children    Pets: 1 cat live inside   Caffeine- Coffee and soda (once weekly) 1-2 cups    Current Meds  Medication Sig  . citalopram (CELEXA) 40 MG tablet Take 1 tablet (40 mg total) by mouth daily.  . Multiple Vitamins-Minerals (  HAIR SKIN AND NAILS FORMULA PO) Take by mouth.  . Omega-3 1000 MG CAPS Take by mouth.  . SPRINTEC 28 0.25-35 MG-MCG tablet TAKE 1 TABLET BY MOUTH DAILY.   No Known Allergies No results found for this or any previous visit (from the past 2160 hour(s)). Objective  Body mass index is 26.29 kg/m. Wt Readings from Last 3 Encounters:  02/21/18 158 lb (71.7 kg)  08/14/17 153 lb (69.4 kg)  12/22/16 140 lb 6.4 oz (63.7 kg)   Temp Readings from Last 3 Encounters:  02/21/18 99.2 F (37.3 C) (Oral)  08/14/17 100 F (37.8 C) (Oral)  12/22/16 99.1 F (37.3 C) (Oral)   BP Readings from Last 3 Encounters:  02/21/18 136/78  08/14/17 134/68  12/22/16 130/70   Pulse Readings from Last 3 Encounters:  02/21/18 87  08/14/17 100  12/22/16 88    Physical Exam  Constitutional: She is oriented to person, place, and time. Vital signs are normal. She appears well-developed and well-nourished. She is cooperative.  HENT:  Head: Normocephalic and atraumatic.  Mouth/Throat: Oropharynx is clear  and moist and mucous membranes are normal.  Eyes: Pupils are equal, round, and reactive to light. Conjunctivae are normal.  Cardiovascular: Normal rate, regular rhythm and normal heart sounds.  Pulmonary/Chest: Effort normal and breath sounds normal.  Genitourinary: Rectal exam shows external hemorrhoid.  Neurological: She is alert and oriented to person, place, and time. Gait normal.  Skin: Skin is warm, dry and intact.  Psychiatric: She has a normal mood and affect. Her speech is normal and behavior is normal. Judgment and thought content normal. Cognition and memory are normal.  Nursing note and vitals reviewed.   Assessment   1. Pelvic pain and vaginal spotting pap neg 08/14/17 neg no HPV testing  2. Constipation chronic since kid and ext hemorrhoid and blood in stool could be 2/2 hemorrhoids Plan   1. Refer to [facility] Dr. Elsie Amis to further w/u  2. Disc methods to prevent constipation  If blood in stool continues consider referral GI. FH pGF and pGGF and pGGM with colon cancer no direct relatives    Provider: Dr. Olivia Mackie McLean-Scocuzza-Internal Medicine

## 2018-02-21 NOTE — Progress Notes (Signed)
Pre visit review using our clinic review tool, if applicable. No additional management support is needed unless otherwise documented below in the visit note. 

## 2018-02-22 ENCOUNTER — Other Ambulatory Visit: Payer: Self-pay | Admitting: Family

## 2018-02-22 DIAGNOSIS — F411 Generalized anxiety disorder: Secondary | ICD-10-CM

## 2018-02-23 NOTE — Telephone Encounter (Signed)
Has not been seen by PCP since 5/18 please advise to refill saw Dr. Aundra Dubin on 7/19

## 2018-02-26 ENCOUNTER — Ambulatory Visit: Payer: Self-pay | Admitting: *Deleted

## 2018-02-26 ENCOUNTER — Telehealth: Payer: Self-pay | Admitting: Family

## 2018-02-26 ENCOUNTER — Encounter: Payer: Self-pay | Admitting: Family

## 2018-02-26 NOTE — Telephone Encounter (Signed)
Copied from [place] [phone]. Topic: Quick Communication - Rx Refill/Question >> Feb 26, 2018  8:25 AM Synthia Innocent wrote: Medication: citalopram (CELEXA) 40 MG tablet   Has the patient contacted their pharmacy? Yes.   (Agent: If no, request that the patient contact the pharmacy for the refill.) (Agent: If yes, when and what did the pharmacy advise?)  Preferred Pharmacy (with phone number or street name): [facility] [address]  Agent: Please be advised that RX refills may take up to 3 business days. We ask that you follow-up with your pharmacy.

## 2018-02-26 NOTE — Telephone Encounter (Signed)
Pt seen by Dr. Aundra Williams- Jacklynn Lewis 02/21/18 for vaginal spotting. Pt states still spotting, mild but heavier at times. States does not have to use tampon. H/O BV. Also reports cramping, intermittent, varies 5-6/10 at times with lower back "Discomfort." Pt instructed to make appt with gynecologist  which she did; scheduled for 03/12/18, first available. Denies fever, dysuria. Pt requesting earlier appt with PCP, "Don't think I should wait 2 weeks." Appt made for Thursday with [name]. Care advise given per protocol. Instructed to call back if symptoms worsen, fever presents.  Answer Assessment - Initial Assessment Questions 1. AMOUNT: "Describe the bleeding that you are having."    - SPOTTING: spotting, or pinkish / brownish mucous discharge; does not fill panti-liner or pad    - MILD:  less than 1 pad / hour; less than patient's usual menstrual bleeding   - MODERATE: 1-2 pads / hour; 1 menstrual cup every 6 hours; small-medium blood clots (e.g., pea, grape, small coin)   - SEVERE: soaking 2 or more pads/hour for 2 or more hours; 1 menstrual cup every 2 hours; bleeding not contained by pads or continuous red blood from vagina; large blood clots (e.g., golf ball, large coin)     Mild but heavier than seen 02/21/18 2. ONSET: "When did the bleeding begin?" "Is it continuing now?"     Few months, spotting intermittently for 1 year 3. MENSTRUAL PERIOD: "When was the last normal menstrual period?" "How is this different than your period?"      4. REGULARITY: "How regular are your periods?"      5. ABDOMINAL PAIN: "Do you have any pain?" "How bad is the pain?"  (e.g., Scale 1-10; mild, moderate, or severe)   - MILD (1-3): doesn't interfere with normal activities, abdomen soft and not tender to touch    - MODERATE (4-7): interferes with normal activities or awakens from sleep, tender to touch    - SEVERE (8-10): excruciating pain, doubled over, unable to do any normal activities      Cramping, varies, mild  to moderate 6. PREGNANCY: "Could you be pregnant?" "Are you sexually active?" "Did you recently give birth?"      7. BREASTFEEDING: "Are you breastfeeding?"      8. HORMONES: "Are you taking any hormone medications, prescription or OTC?" (e.g., birth control pills, estrogen)      9. BLOOD THINNERS: "Do you take any blood thinners?" (e.g., Coumn/warfarin, Pradaxa/dabigatran, aspirin)      10. CAUSE: "What do you think is causing the bleeding?" (e.g., recent gyn surgery, recent gyn procedure; known bleeding disorder, cervical cancer, polycystic ovarian disease, fibroids)          11. HEMODYNAMIC STATUS: "Are you weak or feeling lightheaded?" If so, ask: "Can you stand and walk normally?"         12. OTHER SYMPTOMS: "What other symptoms are you having with the bleeding?" (e.g., passed tissue, vaginal discharge, fever, menstrual-type cramps)      Lower back pain  Protocols used: VAGINAL BLEEDING - ABNORMAL-A-AH

## 2018-02-27 ENCOUNTER — Ambulatory Visit: Payer: Self-pay | Admitting: Family Medicine

## 2018-02-27 NOTE — Telephone Encounter (Signed)
Filled 02/26/18, available at preferred RX.

## 2018-02-28 NOTE — Progress Notes (Signed)
Subjective:    Patient ID: Stephanie Williams, female    DOB: 1985/11/24, 32 y.o.   MRN: [id number]  HPI  [name] is a 32 year old female who presents today for vaginal spotting hat has been present for over one year She describes spotting as "a little heavier" than previous episodes but has lightened up over the past few days.  She reports that her period will end and then 5 days later spotting starts again and this has continued. She reports wearing 2 to 3 panty liners in a day at the worst.. She was treated for BV in January 2019 per patient and she did not notice spotting for 2 months after being treated.  Vaginal discharge: No Vaginal pain: No Dysuria: No Hematuria: No  Fever/Chills/Sweats: No Unusual bleeding or bruising: No  She is not on a blood thinner.  Use of NSAID: No  She is on Sprintec for contraception  She was evaluated on 02/21/18 for spotting and noted that vaginal bleeding/spotting presented in between cycles and occurred after menstrual cycle ceased. She reported that this has occurred over the past year and she has a history of BV.  She was referred to gynecology and she has an appointment in 2 weeks but wanted to be evaluated again before this time.   She was also evaluated for vaginal spotting in January of 2019 and noted that spotting had been occurring over the past 6 months. She noted that spotting was enough for a pantyliner and would last for a few days. She noted increased stress during this time and attributed it to stress.   She was treated January 2019 for BV and pap was negative for malignancy, no HPV testing performed.  One sex partner, female, no condom use.   Constipation is reported as a chronic issue that is concerning for her. She treats constipation with an OTC herbal supplement that has provided limited benefit.  Associated hemorrhoid that can be painful with defecation and has in the past bled. She denies rectal bleeding today. She denies abdominal  pain, N/V, thin caliber stool, melena, or hematochezia.   Review of Systems  Constitutional: Negative for chills and fatigue.  HENT: Positive for ear pain.   Respiratory: Negative for cough, shortness of breath and wheezing.   Cardiovascular: Negative for chest pain and palpitations.  Gastrointestinal: Positive for constipation. Negative for abdominal pain, blood in stool, diarrhea, nausea and vomiting.  Genitourinary: Positive for vaginal bleeding. Negative for dysuria, flank pain, frequency, hematuria, urgency and vaginal pain.  Skin: Negative for rash.  Neurological: Negative for dizziness, weakness and headaches.  Psychiatric/Behavioral:       Denies depressed mood. Increased stress   Past Medical History:  Diagnosis Date  . Anxiety   . Depression      Social History   Socioeconomic History  . Marital status: Divorced    Spouse name: Not on file  . Number of children: Not on file  . Years of education: Not on file  . Highest education level: Not on file  Occupational History  . Not on file  Social Needs  . Financial resource strain: Not on file  . Food insecurity:    Worry: Not on file    Inability: Not on file  . Transportation needs:    Medical: Not on file    Non-medical: Not on file  Tobacco Use  . Smoking status: Never Smoker  . Smokeless tobacco: Never Used  Substance and Sexual Activity  . Alcohol  use: Yes    Alcohol/week: 1.8 oz    Types: 3 Glasses of wine per week  . Drug use: No  . Sexual activity: Yes    Partners: Male  Lifestyle  . Physical activity:    Days per week: Not on file    Minutes per session: Not on file  . Stress: Not on file  Relationships  . Social connections:    Talks on phone: Not on file    Gets together: Not on file    Attends religious service: Not on file    Active member of club or organization: Not on file    Attends meetings of clubs or organizations: Not on file    Relationship status: Not on file  . Intimate  partner violence:    Fear of current or ex partner: Not on file    Emotionally abused: Not on file    Physically abused: Not on file    Forced sexual activity: Not on file  Other Topics Concern  . Not on file  Social History Narrative   Works at [employer]    divorced   No children    Pets: 1 cat live inside   Caffeine- Coffee and soda (once weekly) 1-2 cups     Past Surgical History:  Procedure Laterality Date  . WISDOM TOOTH EXTRACTION  2001    Family History  Problem Relation Age of Onset  . Hypertension Father   . Hypertension Maternal Grandmother   . Cancer Maternal Grandfather        Prostate Cancer  . Cancer Paternal Grandfather 69       Colon Cancer  . Breast cancer Neg Hx     No Known Allergies  Current Outpatient Medications on File Prior to Visit  Medication Sig Dispense Refill  . citalopram (CELEXA) 40 MG tablet TAKE 1 TABLET BY MOUTH EVERY DAY 90 tablet 1  . Multiple Vitamins-Minerals (HAIR SKIN AND NAILS FORMULA PO) Take by mouth.    . Omega-3 1000 MG CAPS Take by mouth.    . SPRINTEC 28 0.25-35 MG-MCG tablet TAKE 1 TABLET BY MOUTH DAILY. 84 tablet 3   No current facility-administered medications on file prior to visit.     BP 124/80 (BP Location: Left Arm, Patient Position: Sitting, Cuff Size: Normal)   Pulse 99   Temp 99.9 F (37.7 C) (Oral)   Resp 16   Wt 152 lb 2 oz (69 kg)   SpO2 99%   BMI 25.31 kg/m        Objective:   Physical Exam  Constitutional: She is oriented to person, place, and time. She appears well-developed and well-nourished.  Eyes: Pupils are equal, round, and reactive to light. No scleral icterus.  Neck: Neck supple.  Cardiovascular: Normal rate, regular rhythm and intact distal pulses.  Pulmonary/Chest: Effort normal and breath sounds normal. She has no wheezes. She has no rales.  Abdominal: Soft. Bowel sounds are normal. There is no tenderness.  Genitourinary: Vagina normal. Rectal exam shows external hemorrhoid.  Pelvic exam was performed with patient supine. There is no rash or lesion on the right labia. There is no rash or lesion on the left labia.  Genitourinary Comments: Light bleeding noted during exam. No obvious source or discharge present. Wet prep completed with chaperone present.  Lymphadenopathy:    She has no cervical adenopathy.  Neurological: She is alert and oriented to person, place, and time.  Skin: Skin is warm and dry. Capillary refill takes  less than 2 seconds. No erythema.  Psychiatric: She has a normal mood and affect. Her behavior is normal. Judgment and thought content normal.          Assessment & Plan:  1. Vaginal spotting Pap negative 08/14/17; no HPV testing; wet prep completed today for BV and yeast. We discussed other testing that can be obtained from a wet prep such as STI testing and patient politely declined wanting testing for BV and yeast only today. Discussed safe sex practices. Advised her to follow up with gynecology that is scheduled in 2 weeks as vaginal spotting has been a chronic issue. She voiced understanding and agreed with plan.  2. Constipation, unspecified constipation type Chronic since childhood. We discussed treatment with Miralax, increasing water and fiber intake. Advised use of stool softener such as Colace if needed. Follow up with PCP if symptom worsens or new symptoms develop particularly blood in stool. She will consider GI referral if needed.  Delano Metz, FNP-C

## 2018-03-01 ENCOUNTER — Other Ambulatory Visit (HOSPITAL_COMMUNITY)
Admission: RE | Admit: 2018-03-01 | Discharge: 2018-03-01 | Disposition: A | Discharge status: Home / Self Care | Payer: 59 | Source: Ambulatory Visit | Attending: Family Medicine | Admitting: Family Medicine

## 2018-03-01 ENCOUNTER — Encounter: Payer: Self-pay | Admitting: Family Medicine

## 2018-03-01 ENCOUNTER — Ambulatory Visit (INDEPENDENT_AMBULATORY_CARE_PROVIDER_SITE_OTHER): Payer: 59 | Admitting: Family Medicine

## 2018-03-01 ENCOUNTER — Encounter

## 2018-03-01 VITALS — BP 124/80 | HR 99 | Temp 99.9°F | Resp 16 | Wt 152.1 lb

## 2018-03-01 DIAGNOSIS — N939 Abnormal uterine and vaginal bleeding, unspecified: Secondary | ICD-10-CM

## 2018-03-01 DIAGNOSIS — K5900 Constipation, unspecified: Secondary | ICD-10-CM | POA: Diagnosis not present

## 2018-03-01 NOTE — Patient Instructions (Signed)
It was a pleasure to meet you today.  The results of your test will be contacted to you by phone.  Miralax can be used for constipation. Try once daily at first and then this can be titrated based upon results.  Follow up with gynecology as scheduled.    Constipation, Adult Constipation is when a person:  Poops (has a bowel movement) fewer times in a week than normal.  Has a hard time pooping.  Has poop that is dry, hard, or bigger than normal.  Follow these instructions at home: Eating and drinking   Eat foods that have a lot of fiber, such as: ? Fresh fruits and vegetables. ? Whole grains. ? Beans.  Eat less of foods that are high in fat, low in fiber, or overly processed, such as: ? [place] fries. ? Hamburgers. ? Cookies. ? Candy. ? Soda.  Drink enough fluid to keep your pee (urine) clear or pale yellow. General instructions  Exercise regularly or as told by your doctor.  Go to the restroom when you feel like you need to poop. Do not hold it in.  Take over-the-counter and prescription medicines only as told by your doctor. These include any fiber supplements.  Do pelvic floor retraining exercises, such as: ? Doing deep breathing while relaxing your lower belly (abdomen). ? Relaxing your pelvic floor while pooping.  Watch your condition for any changes.  Keep all follow-up visits as told by your doctor. This is important. Contact a doctor if:  You have pain that gets worse.  You have a fever.  You have not pooped for 4 days.  You throw up (vomit).  You are not hungry.  You lose weight.  You are bleeding from the anus.  You have thin, pencil-like poop (stool). Get help right away if:  You have a fever, and your symptoms suddenly get worse.  You leak poop or have blood in your poop.  Your belly feels hard or bigger than normal (is bloated).  You have very bad belly pain.  You feel dizzy or you faint. This information is not intended to  replace advice given to you by your health care provider. Make sure you discuss any questions you have with your health care provider. Document Released: 01/11/2008 Document Revised: 02/12/2016 Document Reviewed: 01/13/2016 Elsevier Interactive Patient Education  2018 [employer].

## 2018-03-05 ENCOUNTER — Telehealth: Payer: Self-pay

## 2018-03-05 LAB — CERVICOVAGINAL ANCILLARY ONLY: WET PREP (BD AFFIRM): NEGATIVE

## 2018-03-05 NOTE — Telephone Encounter (Signed)
Copied from [place] [id number]. Topic: Quick Communication - Lab Results >> Mar 05, 2018 12:15 PM Stephanie Williams wrote: Patient is requesting a call back in regards to lab before 1pm or after 5pm. Please advise

## 2018-03-06 NOTE — Telephone Encounter (Signed)
Left voicemail to follow up to see if patient received lab results

## 2018-03-11 ENCOUNTER — Other Ambulatory Visit: Payer: Self-pay

## 2018-03-11 ENCOUNTER — Encounter: Payer: Self-pay | Admitting: Emergency Medicine

## 2018-03-11 ENCOUNTER — Emergency Department
Admission: EM | Admit: 2018-03-11 | Discharge: 2018-03-11 | Disposition: A | Discharge status: Home / Self Care | Payer: 59 | Attending: Emergency Medicine | Admitting: Emergency Medicine

## 2018-03-11 ENCOUNTER — Emergency Department: Payer: 59

## 2018-03-11 DIAGNOSIS — K5900 Constipation, unspecified: Secondary | ICD-10-CM | POA: Diagnosis not present

## 2018-03-11 DIAGNOSIS — R109 Unspecified abdominal pain: Secondary | ICD-10-CM | POA: Diagnosis not present

## 2018-03-11 DIAGNOSIS — Z79899 Other long term (current) drug therapy: Secondary | ICD-10-CM | POA: Diagnosis not present

## 2018-03-11 HISTORY — DX: Constipation, unspecified: K59.00

## 2018-03-11 LAB — LIPASE, BLOOD: Lipase: 36 U/L (ref 11–51)

## 2018-03-11 LAB — URINALYSIS, COMPLETE (UACMP) WITH MICROSCOPIC
BILIRUBIN URINE: NEGATIVE
GLUCOSE, UA: NEGATIVE mg/dL
Hgb urine dipstick: NEGATIVE
Ketones, ur: NEGATIVE mg/dL
Leukocytes, UA: NEGATIVE
Nitrite: NEGATIVE
Protein, ur: NEGATIVE mg/dL
Specific Gravity, Urine: 1.006 (ref 1.005–1.030)
pH: 8 (ref 5.0–8.0)

## 2018-03-11 LAB — COMPREHENSIVE METABOLIC PANEL
ALBUMIN: 4.4 g/dL (ref 3.5–5.0)
ALK PHOS: 44 U/L (ref 38–126)
ALT: 16 U/L (ref 0–44)
AST: 21 U/L (ref 15–41)
Anion gap: 7 (ref 5–15)
BILIRUBIN TOTAL: 0.7 mg/dL (ref 0.3–1.2)
BUN: 10 mg/dL (ref 6–20)
CALCIUM: 8.9 mg/dL (ref 8.9–10.3)
CO2: 27 mmol/L (ref 22–32)
Chloride: 105 mmol/L (ref 98–111)
Creatinine, Ser: 0.67 mg/dL (ref 0.44–1.00)
GFR calc Af Amer: >60 mL/min (ref >60)
GFR calc non Af Amer: >60 mL/min (ref >60)
Glucose, Bld: 87 mg/dL (ref 70–99)
Potassium: 3.3 mmol/L — ABNORMAL LOW (ref 3.5–5.1)
Sodium: 139 mmol/L (ref 135–145)
Total Protein: 7.1 g/dL (ref 6.5–8.1)

## 2018-03-11 LAB — CBC
HCT: 41.9 % (ref 35.0–47.0)
Hemoglobin: 14.5 g/dL (ref 12.0–16.0)
MCH: 32.3 pg (ref 26.0–34.0)
MCHC: 34.6 g/dL (ref 32.0–36.0)
MCV: 93.4 fL (ref 80.0–100.0)
PLATELETS: 259 10*3/uL (ref 150–440)
RBC: 4.49 MIL/uL (ref 3.80–5.20)
RDW: 12 % (ref 11.5–14.5)
WBC: 3.4 10*3/uL — ABNORMAL LOW (ref 3.6–11.0)

## 2018-03-11 LAB — POCT PREGNANCY, URINE: Preg Test, Ur: NEGATIVE

## 2018-03-11 MED ORDER — LACTULOSE 10 G PO PACK: 10.0000 g | PACK | Freq: Three times a day (TID) | ORAL | 0 refills | Status: AC | PRN

## 2018-03-11 NOTE — ED Provider Notes (Signed)
[facility] Emergency Department Provider Note  ____________________________________________   I have reviewed the triage vital signs and the nursing notes. Where available I have reviewed prior notes and, if possible and indicated, outside hospital notes.    HISTORY  Chief Complaint Abdominal Pain    HPI Stephanie Williams is a 32 y.o. 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  She states that she has had no SI or HI.  She states that she took some MiraLAX but usually she tries healthy herbal alternatives for constipation.  She denies any fever chills or abdominal pain or vomiting.  She states she has having bowel movement but they are not satisfying in size.  She states that she recently had a new job and a break-up with her partner.  She denies being abused.  She states when this happens, usually her bowel acts up.  Did have a narrow bowel movement she states that she is worried if perhaps this is cancer.  States she has been reading the Internet about it.     Past Medical History:  Diagnosis Date  . Anxiety   . Constipation   . Depression     Patient Active Problem List   Diagnosis Date Noted  . Dysfunctional uterine bleeding 02/21/2018  . Constipation 02/21/2018  . Hemorrhoids 02/21/2018  . STD exposure 12/22/2016  . Diarrhea 12/22/2016  . Suprapubic pain 12/07/2016  . Routine general medical examination at a health care facility 11/01/2015  . Dysuria 05/18/2015  . Encounter for birth control pills maintenance 04/22/2015  . High risk sexual behavior 04/01/2015  . Generalized anxiety disorder 03/24/2015    Past Surgical History:  Procedure Laterality Date  . WISDOM TOOTH EXTRACTION  2001    Prior to Admission medications   Medication Sig Start Date End Date Taking? Authorizing  Provider  citalopram (CELEXA) 40 MG tablet TAKE 1 TABLET BY MOUTH EVERY DAY 02/26/18  Yes Arnett, Yvetta Coder, FNP  Multiple Vitamins-Minerals (HAIR SKIN AND NAILS FORMULA PO) Take 1 tablet by mouth daily.    Yes [provider]  Omega-3 1000 MG CAPS Take 1 capsule by mouth daily.    Yes [provider]  polyethylene glycol (MIRALAX / GLYCOLAX) packet Take 17 g by mouth daily as needed for mild constipation.   Yes [provider]  [place] 28 0.25-35 MG-MCG tablet TAKE 1 TABLET BY MOUTH DAILY. 06/19/17  Yes Burnard Hawthorne, FNP    Allergies Patient has no known allergies.  Family History  Problem Relation Age of Onset  . Hypertension Father   . Hypertension Maternal Grandmother   . Cancer Maternal Grandfather        Prostate Cancer  . Cancer Paternal Grandfather 65       Colon Cancer  . Breast cancer Neg Hx     Social History Social History   Tobacco Use  . Smoking status: Never Smoker  . Smokeless tobacco: Never Used  Substance Use Topics  . Alcohol use: Yes    Alcohol/week: 1.8 oz    Types: 3 Glasses of wine per week  . Drug use: No    Review of Systems Constitutional: No fever/chills Eyes: No visual changes. ENT: No sore throat. No stiff neck no neck pain Cardiovascular: Denies chest pain. Respiratory: Denies shortness of breath. Gastrointestinal:   no vomiting.  No  diarrhea.  + constipation. Genitourinary: Negative for dysuria. Musculoskeletal: Negative lower extremity swelling Skin: Negative for rash. Neurological: Negative for severe headaches, focal weakness or numbness.   ____________________________________________   PHYSICAL EXAM:  VITAL SIGNS: ED Triage Vitals  Enc Vitals Group     BP 03/11/18 0935 (!) 144/78     Pulse Rate 03/11/18 0935 92     Resp 03/11/18 0935 16     Temp 03/11/18 0935 98.8 F (37.1 C)     Temp Source 03/11/18 0935 Oral     SpO2 03/11/18 0935 100 %     Weight 03/11/18 0933 152 lb (68.9 kg)      Height 03/11/18 0933 5\' 5"  (1.651 m)     Head Circumference --      Peak Flow --      Pain Score 03/11/18 0933 7     Pain Loc --      Pain Edu? --      Excl. in [place]? --     Constitutional: Alert and oriented. Well appearing and in no acute distress. Eyes: Conjunctivae are normal Head: Atraumatic HEENT: No congestion/rhinnorhea. Mucous membranes are moist.  Oropharynx non-erythematous Neck:   Nontender with no meningismus, no masses, no stridor Cardiovascular: Normal rate, regular rhythm. Grossly normal heart sounds.  Good peripheral circulation. Respiratory: Normal respiratory effort.  No retractions. Lungs CTAB. Abdominal: Soft and nontender. No distention. No guarding no rebound Back:  There is no focal tenderness or step off.  there is no midline tenderness there are no lesions noted. there is no CVA tenderness Musculoskeletal: No lower extremity tenderness, no upper extremity tenderness. No joint effusions, no DVT signs strong distal pulses no edema Neurologic:  Normal speech and language. No gross focal neurologic deficits are appreciated.  Skin:  Skin is warm, dry and intact. No rash noted. Psychiatric: Mood and affect are anxious. Speech and behavior are normal.  ____________________________________________   LABS (all labs ordered are listed, but only abnormal results are displayed)  Labs Reviewed  COMPREHENSIVE METABOLIC PANEL - Abnormal; Notable for the following components:      Result Value   Potassium 3.3 (*)    All other components within normal limits  CBC - Abnormal; Notable for the following components:   WBC 3.4 (*)    All other components within normal limits  URINALYSIS, COMPLETE (UACMP) WITH MICROSCOPIC - Abnormal; Notable for the following components:   Color, Urine YELLOW (*)    APPearance CLEAR (*)    Bacteria, UA RARE (*)    All other components within normal limits  LIPASE, BLOOD  POC URINE PREG, ED  POCT PREGNANCY, URINE    Pertinent labs   results that were available during my care of the patient were reviewed by me and considered in my medical decision making (see chart for details). ____________________________________________  EKG  I personally interpreted any EKGs ordered by me or triage  ____________________________________________  RADIOLOGY  Pertinent labs & imaging results that were available during my care of the patient were reviewed by me and considered in my medical decision making (see chart for details). If possible, patient and/or family made aware of any abnormal findings.  Dg Abdomen 1 View  Result Date: 03/11/2018 CLINICAL DATA:  Constipation for 2 weeks.  Chronic constipation. EXAM: ABDOMEN - 1 VIEW COMPARISON:  None. FINDINGS: The bowel gas pattern is nonobstructive. Colonic stool burden is at the upper limits of normal. No bowel wall thickening or supine evidence of free intraperitoneal air. There  are no suspicious abdominal calcifications. The bones appear unremarkable. IMPRESSION: No acute findings. Colonic stool burden felt to be at the upper limits of normal. Electronically Signed   By: Richardean Sale M.D.   On: 03/11/2018 12:50   ____________________________________________    PROCEDURES  Procedure(s) performed: None  Procedures  Critical Care performed: None  ____________________________________________   INITIAL IMPRESSION / ASSESSMENT AND PLAN / ED COURSE  Pertinent labs & imaging results that were available during my care of the patient were reviewed by me and considered in my medical decision making (see chart for details).  She complaining of constipation for her whole life, patient does have x-ray which shows some degree of constipation certainly no evidence of obstruction clinically blood work and vital signs and work-up is reassuring.  We will send her home with medication to help remedy this, I will refer her to GI.  No indication for CT scan.  Patient has no evidence of thoughts  of self-harm, she does suffer from anxiety, she sees her PCP for that I would prefer not to be referred to psychiatry.    ____________________________________________   FINAL CLINICAL IMPRESSION(S) / ED DIAGNOSES  Final diagnoses:  None      This chart was dictated using voice recognition software.  Despite best efforts to proofread,  errors can occur which can change meaning.     Schuyler Amor, MD 03/11/18 1308

## 2018-03-11 NOTE — ED Notes (Signed)
Pt reports constipation with hx of same. Reports abd pain as "discomfort" extending into back. Denies rectal pain.

## 2018-03-11 NOTE — ED Notes (Signed)
Pt ambulated to xray without difficulty.

## 2018-03-11 NOTE — ED Triage Notes (Signed)
C/O abdominal pain and back pain.  States pain pain has been intermittent for the past two weeks.  Patient states she has history of constipation.  Last BM 03/10/2018./ States took Miralax yesterday with small results.

## 2018-03-11 NOTE — ED Notes (Signed)
First Nurse Note:  Patient complaining of epigastric pain.  Abdominal pain ongoing X 2 weeks, here this AM because it has worsened.  States she swallowed a cherry pit recently.  Has taken Miralax recently.

## 2018-03-12 ENCOUNTER — Encounter: Payer: Self-pay | Admitting: Obstetrics & Gynecology

## 2018-03-12 ENCOUNTER — Ambulatory Visit (INDEPENDENT_AMBULATORY_CARE_PROVIDER_SITE_OTHER): Payer: 59 | Admitting: Obstetrics & Gynecology

## 2018-03-12 VITALS — BP 122/78 | HR 72 | Wt 156.6 lb

## 2018-03-12 DIAGNOSIS — N923 Ovulation bleeding: Secondary | ICD-10-CM | POA: Diagnosis not present

## 2018-03-12 DIAGNOSIS — N921 Excessive and frequent menstruation with irregular cycle: Secondary | ICD-10-CM

## 2018-03-12 MED ORDER — DOXYCYCLINE HYCLATE 100 MG PO CAPS: 100.0000 mg | ORAL_CAPSULE | Freq: Two times a day (BID) | ORAL | 0 refills | Status: DC

## 2018-03-12 NOTE — Progress Notes (Signed)
GYNECOLOGY OFFICE VISIT NOTE  History:  32 y.o. F here today for evaluation of chronic spotting.  Has been on Sprintec for several years, but spotting started one year ago. Initially occurred mid-cycle for about a week, then she would have a period. But lately, she had more days in month with spotting.  In January, she was diagnosed with BV and treated with Metronidazole; she noticed not having spotting for February and March, but it resumed in April.  She had a normal pap smear in January. She denies any current abnormal vaginal discharge, bleeding, pelvic pain or other concerns.   Past Medical History:  Diagnosis Date  . Anxiety   . Constipation   . Depression     Past Surgical History:  Procedure Laterality Date  . [place] EXTRACTION  2001    The following portions of the patient's history were reviewed and updated as appropriate: allergies, current medications, past family history, past medical history, past social history, past surgical history and problem list.   Health Maintenance:  Normal pap on 08/14/2017.  Review of Systems:  Pertinent items noted in HPI and remainder of comprehensive ROS otherwise negative.  Objective:  Physical Exam BP 122/78   Pulse 72   Wt 156 lb 9.6 oz (71 kg)   LMP 03/05/2018 Comment: neg preg test  BMI 26.06 kg/m  CONSTITUTIONAL: Well-developed, well-nourished female in no acute distress.  HEENT:  Normocephalic, atraumatic. External right and left ear normal. No scleral icterus.  NECK: Normal range of motion, supple, no masses noted on observation SKIN: Skin is warm and dry. No rash noted. Not diaphoretic. No erythema. No pallor. MUSCULOSKELETAL: Normal range of motion. No edema noted. NEUROLOGIC: Alert and oriented to person, place, and time. Normal muscle tone coordination. No cranial nerve deficit noted. PSYCHIATRIC: Normal mood and affect. Normal behavior. Normal judgment and thought content. CARDIOVASCULAR: Normal heart rate  noted RESPIRATORY: Effort and breath sounds normal, no problems with respiration noted ABDOMEN: Soft, no distention noted.   PELVIC: Normal appearing external genitalia; normal appearing vaginal mucosa and cervix.  No abnormal discharge noted.  Normal uterine size, no other palpable masses, no uterine or adnexal tenderness.  Labs and Imaging Results for orders placed or performed during the hospital encounter of 03/11/18 (from the past 168 hour(s))  Lipase, blood   Collection Time: 03/11/18  9:55 AM  Result Value Ref Range   Lipase 36 11 - 51 U/L  Comprehensive metabolic panel   Collection Time: 03/11/18  9:55 AM  Result Value Ref Range   Sodium 139 135 - 145 mmol/L   Potassium 3.3 (L) 3.5 - 5.1 mmol/L   Chloride 105 98 - 111 mmol/L   CO2 27 22 - 32 mmol/L   Glucose, Bld 87 70 - 99 mg/dL   BUN 10 6 - 20 mg/dL   Creatinine, Ser 0.67 0.44 - 1.00 mg/dL   Calcium 8.9 8.9 - 10.3 mg/dL   Total Protein 7.1 6.5 - 8.1 g/dL   Albumin 4.4 3.5 - 5.0 g/dL   AST 21 15 - 41 U/L   ALT 16 0 - 44 U/L   Alkaline Phosphatase 44 38 - 126 U/L   Total Bilirubin 0.7 0.3 - 1.2 mg/dL   GFR calc non Af Amer >60 >60 mL/min   GFR calc Af Amer >60 >60 mL/min   Anion gap 7 5 - 15  CBC   Collection Time: 03/11/18  9:55 AM  Result Value Ref Range   WBC 3.4 (L)  3.6 - 11.0 K/uL   RBC 4.49 3.80 - 5.20 MIL/uL   Hemoglobin 14.5 12.0 - 16.0 g/dL   HCT 41.9 35.0 - 47.0 %   MCV 93.4 80.0 - 100.0 fL   MCH 32.3 26.0 - 34.0 pg   MCHC 34.6 32.0 - 36.0 g/dL   RDW 12.0 11.5 - 14.5 %   Platelets 259 150 - 440 K/uL  Urinalysis, Complete w Microscopic   Collection Time: 03/11/18  9:55 AM  Result Value Ref Range   Color, Urine YELLOW (A) YELLOW   APPearance CLEAR (A) CLEAR   Specific Gravity, Urine 1.006 1.005 - 1.030   pH 8.0 5.0 - 8.0   Glucose, UA NEGATIVE NEGATIVE mg/dL   Hgb urine dipstick NEGATIVE NEGATIVE   Bilirubin Urine NEGATIVE NEGATIVE   Ketones, ur NEGATIVE NEGATIVE mg/dL   Protein, ur NEGATIVE  NEGATIVE mg/dL   Nitrite NEGATIVE NEGATIVE   Leukocytes, UA NEGATIVE NEGATIVE   RBC / HPF 0-5 0 - 5 RBC/hpf   WBC, UA 0-5 0 - 5 WBC/hpf   Bacteria, UA RARE (A) NONE SEEN   Squamous Epithelial / LPF 0-5 0 - 5  Pregnancy, urine POC   Collection Time: 03/11/18 10:15 AM  Result Value Ref Range   Preg Test, Ur NEGATIVE NEGATIVE   Dg Abdomen 1 View  Result Date: 03/11/2018 CLINICAL DATA:  Constipation for 2 weeks.  Chronic constipation. EXAM: ABDOMEN - 1 VIEW COMPARISON:  None. FINDINGS: The bowel gas pattern is nonobstructive. Colonic stool burden is at the upper limits of normal. No bowel wall thickening or supine evidence of free intraperitoneal air. There are no suspicious abdominal calcifications. The bones appear unremarkable. IMPRESSION: No acute findings. Colonic stool burden felt to be at the upper limits of normal. Electronically Signed   By: Richardean Sale M.D.   On: 03/11/2018 12:50    Assessment & Plan:  1. Spotting between menses 2. Breakthrough bleeding on OCPs Unusual spotting pattern for over a year, alleviated after antibiotic treatment.  Discussed possibility of endometritis, will do trial of Doxycyline treatment. Will also evaluate for structural anomalies, ultrasound ordered. She had normal lab evaluation including a normal TSH.  Discussed change of OCP formulation or change of modality to constant hormonal regimen (IUD, Depo Provera, patch, ring), she declines this for now. Will consider if AUB continues. - US PELVIC COMPLETE WITH TRANSVAGINAL; Future - doxycycline (VIBRAMYCIN) 100 MG capsule; Take 1 capsule (100 mg total) by mouth 2 (two) times daily.  Dispense: 14 capsule; Refill: 0 Patient will be contacted with results.  Routine preventative health maintenance measures emphasized. Please refer to After Visit Summary for other counseling recommendations.   Return for any gynecologic concerns.   Total face-to-face time with patient: 20 minutes.  Over 50% of encounter  was spent on counseling and coordination of care.   Verita Schneiders, MD, [place] for [employer], [place]

## 2018-03-12 NOTE — Progress Notes (Signed)
This is your patient   [place]

## 2018-03-12 NOTE — Patient Instructions (Signed)
Return to clinic for any scheduled appointments or for any gynecologic concerns as needed.   

## 2018-03-13 ENCOUNTER — Ambulatory Visit (INDEPENDENT_AMBULATORY_CARE_PROVIDER_SITE_OTHER): Payer: 59 | Admitting: Gastroenterology

## 2018-03-13 ENCOUNTER — Encounter: Payer: Self-pay | Admitting: Gastroenterology

## 2018-03-13 ENCOUNTER — Other Ambulatory Visit: Payer: Self-pay

## 2018-03-13 VITALS — BP 125/88 | HR 89 | Ht 65.0 in | Wt 157.0 lb

## 2018-03-13 DIAGNOSIS — K5900 Constipation, unspecified: Secondary | ICD-10-CM | POA: Diagnosis not present

## 2018-03-13 NOTE — Patient Instructions (Signed)
High-Fiber Diet  Fiber, also called dietary fiber, is a type of carbohydrate found in fruits, vegetables, whole grains, and beans. A high-fiber diet can have many health benefits. Your health care provider may recommend a high-fiber diet to help:  · Prevent constipation. Fiber can make your bowel movements more regular.  · Lower your cholesterol.  · Relieve hemorrhoids, uncomplicated diverticulosis, or irritable bowel syndrome.  · Prevent overeating as part of a weight-loss plan.  · Prevent heart disease, type 2 diabetes, and certain cancers.    What is my plan?  The recommended daily intake of fiber includes:  · 38 grams for men under age 50.  · 30 grams for men over age 50.  · 25 grams for women under age 50.  · 21 grams for women over age 50.    You can get the recommended daily intake of dietary fiber by eating a variety of fruits, vegetables, grains, and beans. Your health care provider may also recommend a fiber supplement if it is not possible to get enough fiber through your diet.  What do I need to know about a high-fiber diet?  · Fiber supplements have not been widely studied for their effectiveness, so it is better to get fiber through food sources.  · Always check the fiber content on the nutrition facts label of any prepackaged food. Look for foods that contain at least 5 grams of fiber per serving.  · Ask your dietitian if you have questions about specific foods that are related to your condition, especially if those foods are not listed in the following section.  · Increase your daily fiber consumption gradually. Increasing your intake of dietary fiber too quickly may cause bloating, cramping, or gas.  · Drink plenty of water. Water helps you to digest fiber.  What foods can I eat?  Grains  Whole-grain breads. Multigrain cereal. Oats and oatmeal. Brown rice. Barley. Bulgur wheat. Millet. Bran muffins. Popcorn. Rye wafer crackers.  Vegetables   Sweet potatoes. Spinach. Kale. Artichokes. Cabbage. Broccoli. Green peas. Carrots. Squash.  Fruits  Berries. Pears. Apples. Oranges. Avocados. Prunes and raisins. Dried figs.  Meats and Other Protein Sources  Navy, kidney, pinto, and soy beans. Split peas. Lentils. Nuts and seeds.  Dairy  Fiber-fortified yogurt.  Beverages  Fiber-fortified soy milk. Fiber-fortified orange juice.  Other  Fiber bars.  The items listed above may not be a complete list of recommended foods or beverages. Contact your dietitian for more options.  What foods are not recommended?  Grains  White bread. Pasta made with refined flour. White rice.  Vegetables  Fried potatoes. Canned vegetables. Well-cooked vegetables.  Fruits  Fruit juice. Cooked, strained fruit.  Meats and Other Protein Sources  Fatty cuts of meat. Fried poultry or fried fish.  Dairy  Milk. Yogurt. Cream cheese. Sour cream.  Beverages  Soft drinks.  Other  Cakes and pastries. Butter and oils.  The items listed above may not be a complete list of foods and beverages to avoid. Contact your dietitian for more information.  What are some tips for including high-fiber foods in my diet?  · Eat a wide variety of high-fiber foods.  · Make sure that half of all grains consumed each day are whole grains.  · Replace breads and cereals made from refined flour or white flour with whole-grain breads and cereals.  · Replace white rice with brown rice, bulgur wheat, or millet.  · Start the day with a breakfast that is high in fiber,   such as a cereal that contains at least 5 grams of fiber per serving.  · Use beans in place of meat in soups, salads, or pasta.  · Eat high-fiber snacks, such as berries, raw vegetables, nuts, or popcorn.  This information is not intended to replace advice given to you by your health care provider. Make sure you discuss any questions you have with your health care provider.  Document Released: 07/25/2005 Document Revised: 12/31/2015 Document Reviewed: 01/07/2014   Elsevier Interactive Patient Education © 2018 Elsevier Inc.

## 2018-03-13 NOTE — Progress Notes (Signed)
Jonathon Bellows MD, MRCP(U.K) [address]  [place]  [place], [place] [address]  Main: [phone]  Fax: [phone]   Gastroenterology Consultation  Referring Provider:     Burnard Hawthorne, FNP Primary Care Physician:  Burnard Hawthorne, FNP Primary Gastroenterologist:  Dr. Jonathon Bellows  Reason for Consultation:     Constipation         HPI:   Stephanie Williams is a 32 y.o. y/o female referred for consultation & management  by Dr. Vidal Schwalbe, Yvetta Coder, FNP.    She has been referred from ER when she presented 2 days back on 03/11/18 with severe constipation. X ray abdomen showed stool burden.   CBC Latest Ref Rng & Units 03/11/2018 12/08/2016 11/04/2015  WBC 3.6 - 11.0 K/uL 3.4(L) 4.9 3.3(L)  Hemoglobin 12.0 - 16.0 g/dL 14.5 13.9 13.3  Hematocrit 35.0 - 47.0 % 41.9 41.1 38.4  Platelets 150 - 440 K/uL 259 303.0 282.0   She say she has had issues with constipation . She has a bowel movement usually every other day or third, usually very hard like rocks. Lot of pain, thinks she has a hemorrhoid. Some blood noticed. She says that she was prescribed lactulose and has been working well. For years she has taken "all natural laxative". Usually the herbal laxative worked till a week back. Her dad had 1 pre cancerous polyp- he was 47. Her grandfather had colon cancer . Great grand parents had colon cancer.   Her father also had issues with constipation. She says that she consumes more vegetables than fruit on a daily basis.    TSH- Normal Past Medical History:  Diagnosis Date  . Anxiety   . Constipation   . Depression     Past Surgical History:  Procedure Laterality Date  . WISDOM TOOTH EXTRACTION  2001    Prior to Admission medications   Medication Sig Start Date End Date Taking? Authorizing Provider  citalopram (CELEXA) 40 MG tablet TAKE 1 TABLET BY MOUTH EVERY DAY 02/26/18   Burnard Hawthorne, FNP  doxycycline (VIBRAMYCIN) 100 MG capsule Take 1 capsule (100 mg total) by mouth 2 (two)  times daily. 03/12/18   Anyanwu, Sallyanne Havers, MD  lactulose (CEPHULAC) 10 g packet Take 1 packet (10 g total) by mouth 3 (three) times daily as needed (constipation). 03/11/18   Schuyler Amor, MD  Multiple Vitamins-Minerals (HAIR SKIN AND NAILS FORMULA PO) Take 1 tablet by mouth daily.     [provider]  Omega-3 1000 MG CAPS Take 1 capsule by mouth daily.     [provider]  polyethylene glycol (MIRALAX / GLYCOLAX) packet Take 17 g by mouth daily as needed for mild constipation.    [provider]  [place] 28 0.25-35 MG-MCG tablet TAKE 1 TABLET BY MOUTH DAILY. 06/19/17   Burnard Hawthorne, FNP    Family History  Problem Relation Age of Onset  . Hypertension Father   . Hypertension Maternal Grandmother   . Cancer Maternal Grandfather        Prostate Cancer  . Cancer Paternal Grandfather 52       Colon Cancer  . Breast cancer Neg Hx      Social History   Tobacco Use  . Smoking status: Never Smoker  . Smokeless tobacco: Never Used  Substance Use Topics  . Alcohol use: Yes    Alcohol/week: 1.8 oz    Types: 3 Glasses of wine per week  . Drug use: No  Allergies as of 03/13/2018  . (No Known Allergies)    Review of Systems:    All systems reviewed and negative except where noted in HPI.   Physical Exam:  LMP 03/05/2018 Comment: neg preg test Patient's last menstrual period was 03/05/2018. Psych:  Alert and cooperative. Normal mood and affect. General:   Alert,  Well-developed, well-nourished, pleasant and cooperative in NAD Head:  Normocephalic and atraumatic. Eyes:  Sclera clear, no icterus.   Conjunctiva pink. Ears:  Normal auditory acuity. Nose:  No deformity, discharge, or lesions. Mouth:  No deformity or lesions,oropharynx pink & moist. Neck:  Supple; no masses or thyromegaly. Lungs:  Respirations even and unlabored.  Clear throughout to auscultation.   No wheezes, crackles, or rhonchi. No acute distress. Heart:  Regular rate and rhythm;  no murmurs, clicks, rubs, or gallops. Abdomen:  Normal bowel sounds.  No bruits.  Soft, non-tender and non-distended without masses, hepatosplenomegaly or hernias noted.  No guarding or rebound tenderness.    Extremities:  No clubbing or edema.  No cyanosis. Neurologic:  Alert and oriented x3;  grossly normal neurologically. Skin:  Intact without significant lesions or rashes. No jaundice. Lymph Nodes:  No significant cervical adenopathy. Psych:  Alert and cooperative. Normal mood and affect.  Imaging Studies: Dg Abdomen 1 View  Result Date: 03/11/2018 CLINICAL DATA:  Constipation for 2 weeks.  Chronic constipation. EXAM: ABDOMEN - 1 VIEW COMPARISON:  None. FINDINGS: The bowel gas pattern is nonobstructive. Colonic stool burden is at the upper limits of normal. No bowel wall thickening or supine evidence of free intraperitoneal air. There are no suspicious abdominal calcifications. The bones appear unremarkable. IMPRESSION: No acute findings. Colonic stool burden felt to be at the upper limits of normal. Electronically Signed   By: Richardean Sale M.D.   On: 03/11/2018 12:50    Assessment and Plan:   Stephanie Williams is a 64 y.o. y/o female has been referred for constipation.   Plan  1. Colonoscopy  2..High fiber diet- information provided      I have discussed alternative options, risks & benefits,  which include, but are not limited to, bleeding, infection, perforation,respiratory complication & drug reaction.  The patient agrees with this plan & written consent will be obtained.     Follow up PRN  Dr Jonathon Bellows MD,MRCP(U.K)

## 2018-03-16 ENCOUNTER — Ambulatory Visit: Payer: 59

## 2018-03-21 ENCOUNTER — Ambulatory Visit
Admission: RE | Admit: 2018-03-21 | Discharge: 2018-03-21 | Disposition: A | Discharge status: Home / Self Care | Payer: 59 | Source: Ambulatory Visit | Attending: Obstetrics & Gynecology | Admitting: Obstetrics & Gynecology

## 2018-03-21 DIAGNOSIS — N939 Abnormal uterine and vaginal bleeding, unspecified: Secondary | ICD-10-CM | POA: Diagnosis not present

## 2018-03-21 DIAGNOSIS — N923 Ovulation bleeding: Secondary | ICD-10-CM | POA: Diagnosis not present

## 2018-03-22 ENCOUNTER — Ambulatory Visit: Payer: 59 | Admitting: Certified Registered"

## 2018-03-22 ENCOUNTER — Encounter: Payer: Self-pay | Admitting: *Deleted

## 2018-03-22 ENCOUNTER — Encounter
Admission: RE | Disposition: A | Discharge status: Home / Self Care | Payer: Self-pay | Source: Ambulatory Visit | Attending: Gastroenterology

## 2018-03-22 ENCOUNTER — Ambulatory Visit
Admission: RE | Admit: 2018-03-22 | Discharge: 2018-03-22 | Disposition: A | Discharge status: Home / Self Care | Payer: 59 | Source: Ambulatory Visit | Attending: Gastroenterology | Admitting: Gastroenterology

## 2018-03-22 DIAGNOSIS — K5900 Constipation, unspecified: Secondary | ICD-10-CM | POA: Diagnosis present

## 2018-03-22 DIAGNOSIS — F419 Anxiety disorder, unspecified: Secondary | ICD-10-CM | POA: Diagnosis not present

## 2018-03-22 DIAGNOSIS — F329 Major depressive disorder, single episode, unspecified: Secondary | ICD-10-CM | POA: Insufficient documentation

## 2018-03-22 DIAGNOSIS — Z79899 Other long term (current) drug therapy: Secondary | ICD-10-CM | POA: Diagnosis not present

## 2018-03-22 HISTORY — PX: COLONOSCOPY WITH PROPOFOL: SHX5780

## 2018-03-22 LAB — POCT PREGNANCY, URINE: PREG TEST UR: NEGATIVE

## 2018-03-22 SURGERY — COLONOSCOPY WITH PROPOFOL
Anesthesia: General

## 2018-03-22 MED ORDER — LIDOCAINE HCL (CARDIAC) PF 100 MG/5ML IV SOSY
PREFILLED_SYRINGE | INTRAVENOUS | Status: DC | PRN
  Administered 2018-03-22: 50 mg via INTRAVENOUS

## 2018-03-22 MED ORDER — PROPOFOL 500 MG/50ML IV EMUL
INTRAVENOUS | Status: DC | PRN
  Administered 2018-03-22: 130 ug/kg/min via INTRAVENOUS

## 2018-03-22 MED ORDER — PROPOFOL 10 MG/ML IV BOLUS
INTRAVENOUS | Status: DC | PRN
  Administered 2018-03-22: 80 mg via INTRAVENOUS
  Administered 2018-03-22: 20 mg via INTRAVENOUS

## 2018-03-22 MED ORDER — SODIUM CHLORIDE 0.9 % IV SOLN
INTRAVENOUS | Status: DC
  Administered 2018-03-22: 1000 mL via INTRAVENOUS

## 2018-03-22 NOTE — Op Note (Signed)
[facility] Gastroenterology Patient Name: Stephanie Williams Procedure Date: 03/22/2018 10:07 AM MRN: [id number] Account #: [id number] Date of Birth: Jun 22, 1986 Admit Type: Outpatient Age: 32 Room: [facility] ENDO ROOM 3 Gender: Female Note Status: Finalized Procedure:            Colonoscopy Indications:          Constipation Providers:            Jonathon Bellows MD, MD Referring MD:         Yvetta Coder. Arnett (Referring MD) Medicines:            Monitored Anesthesia Care Complications:        No immediate complications. Procedure:            Pre-Anesthesia Assessment:                       - Prior to the procedure, a History and Physical was                        performed, and patient medications, allergies and                        sensitivities were reviewed. The patient's tolerance of                        previous anesthesia was reviewed.                       - The risks and benefits of the procedure and the                        sedation options and risks were discussed with the                        patient. All questions were answered and informed                        consent was obtained.                       - ASA Grade Assessment: I - A normal, healthy patient.                       After obtaining informed consent, the colonoscope was                        passed under direct vision. Throughout the procedure,                        the patient's blood pressure, pulse, and oxygen                        saturations were monitored continuously. The                        Colonoscope was introduced through the anus and                        advanced to the the cecum, identified by the  appendiceal orifice, IC valve and transillumination.                        The colonoscopy was performed with ease. The patient                        tolerated the procedure well. The quality of the bowel                        preparation was  good. Findings:      The perianal and digital rectal examinations were normal.      The entire examined colon appeared normal on direct and retroflexion       views. Impression:           - The entire examined colon is normal on direct and                        retroflexion views.                       - No specimens collected. Recommendation:       - Discharge patient to home (with escort).                       - Resume previous diet.                       - Continue present medications.                       - Return to my office PRN. Procedure Code(s):    --- Professional ---                       [phone], Colonoscopy, flexible; diagnostic, including                        collection of specimen(s) by brushing or washing, when                        performed (separate procedure) Diagnosis Code(s):    --- Professional ---                       K59.00, Constipation, unspecified CPT copyright 2017 American Medical Association. All rights reserved. The codes documented in this report are preliminary and upon coder review may  be revised to meet current compliance requirements. Jonathon Bellows, MD Jonathon Bellows MD, MD 03/22/2018 10:38:22 AM This report has been signed electronically. Number of Addenda: 0 Note Initiated On: 03/22/2018 10:07 AM Scope Withdrawal Time: 0 hours 9 minutes 4 seconds  Total Procedure Duration: 0 hours 16 minutes 25 seconds       [facility]

## 2018-03-22 NOTE — Anesthesia Postprocedure Evaluation (Signed)
Anesthesia Post Note  Patient: Stephanie Williams  Procedure(s) Performed: COLONOSCOPY WITH PROPOFOL (N/A )  Patient location during evaluation: PACU Anesthesia Type: General Level of consciousness: awake and alert Pain management: pain level controlled Vital Signs Assessment: post-procedure vital signs reviewed and stable Respiratory status: spontaneous breathing, nonlabored ventilation and respiratory function stable Cardiovascular status: blood pressure returned to baseline and stable Postop Assessment: no apparent nausea or vomiting Anesthetic complications: no     Last Vitals:  Vitals:   03/22/18 1049 03/22/18 1059  BP: 107/64 112/79  Pulse:    Resp:    Temp:    SpO2:      Last Pain:  Vitals:   03/22/18 1059  TempSrc:   PainSc: 0-No pain                 Durenda Hurt

## 2018-03-22 NOTE — Anesthesia Procedure Notes (Signed)
Performed by: Samad Thon, CRNA Pre-anesthesia Checklist: Patient identified, Emergency Drugs available, Suction available, Patient being monitored and Timeout performed Patient Re-evaluated:Patient Re-evaluated prior to induction Oxygen Delivery Method: Nasal cannula Induction Type: IV induction       

## 2018-03-22 NOTE — Transfer of Care (Signed)
Immediate Anesthesia Transfer of Care Note  Patient: Stephanie Williams  Procedure(s) Performed: COLONOSCOPY WITH PROPOFOL (N/A )  Patient Location: PACU  Anesthesia Type:General  Level of Consciousness: awake and responds to stimulation  Airway & Oxygen Therapy: Patient Spontanous Breathing and Patient connected to nasal cannula oxygen  Post-op Assessment: Report given to RN and Post -op Vital signs reviewed and stable  Post vital signs: Reviewed and stable  Last Vitals:  Vitals Value Taken Time  BP 104/62 03/22/2018 10:40 AM  Temp 36.1 C 03/22/2018 10:39 AM  Pulse 74 03/22/2018 10:40 AM  Resp 18 03/22/2018 10:40 AM  SpO2 100 % 03/22/2018 10:40 AM    Last Pain:  Vitals:   03/22/18 1039  TempSrc: Tympanic  PainSc: 0-No pain         Complications: No apparent anesthesia complications

## 2018-03-22 NOTE — Anesthesia Post-op Follow-up Note (Signed)
Anesthesia QCDR form completed.        

## 2018-03-22 NOTE — H&P (Signed)
Jonathon Bellows, MD [address], [place], [place], [place], [address] [address], [place], [place], [place], [address] Phone: [phone]  Fax: [phone]  Primary Care Physician:  Burnard Hawthorne, FNP   Pre-Procedure History & Physical: HPI:  Shivon Hackel is a 32 y.o. female is here for an colonoscopy.   Past Medical History:  Diagnosis Date  . Anxiety   . Constipation   . Depression     Past Surgical History:  Procedure Laterality Date  . WISDOM TOOTH EXTRACTION  2001    Prior to Admission medications   Medication Sig Start Date End Date Taking? Authorizing Provider  citalopram (CELEXA) 40 MG tablet TAKE 1 TABLET BY MOUTH EVERY DAY 02/26/18  Yes Arnett, Yvetta Coder, FNP  lactulose (CEPHULAC) 10 g packet Take 1 packet (10 g total) by mouth 3 (three) times daily as needed (constipation). 03/11/18  Yes Schuyler Amor, MD  Multiple Vitamins-Minerals (HAIR SKIN AND NAILS FORMULA PO) Take 1 tablet by mouth daily.    Yes [provider]  Omega-3 1000 MG CAPS Take 1 capsule by mouth daily.    Yes [provider]  polyethylene glycol (MIRALAX / GLYCOLAX) packet Take 17 g by mouth daily as needed for mild constipation.   Yes [provider]  Felsenthal 28 0.25-35 MG-MCG tablet TAKE 1 TABLET BY MOUTH DAILY. 06/19/17  Yes Arnett, Yvetta Coder, FNP  doxycycline (VIBRAMYCIN) 100 MG capsule Take 1 capsule (100 mg total) by mouth 2 (two) times daily. Patient not taking: Reported on 03/22/2018 03/12/18   Osborne Oman, MD    Allergies as of 03/14/2018  . (No Known Allergies)    Family History  Problem Relation Age of Onset  . Hypertension Father   . Hypertension Maternal Grandmother   . Cancer Maternal Grandfather        Prostate Cancer  . Cancer Paternal Grandfather 54       Colon Cancer  . Breast cancer Neg Hx     Social History   Socioeconomic History  . Marital status: Single    Spouse name: Not on file  . Number of children: Not on file    . Years of education: Not on file  . Highest education level: Not on file  Occupational History  . Not on file  Social Needs  . Financial resource strain: Not on file  . Food insecurity:    Worry: Not on file    Inability: Not on file  . Transportation needs:    Medical: Not on file    Non-medical: Not on file  Tobacco Use  . Smoking status: Never Smoker  . Smokeless tobacco: Never Used  Substance and Sexual Activity  . Alcohol use: Yes    Alcohol/week: 3.0 standard drinks    Types: 3 Glasses of wine per week  . Drug use: No  . Sexual activity: Yes    Partners: Male  Lifestyle  . Physical activity:    Days per week: Not on file    Minutes per session: Not on file  . Stress: Not on file  Relationships  . Social connections:    Talks on phone: Not on file    Gets together: Not on file    Attends religious service: Not on file    Active member of club or organization: Not on file    Attends meetings of clubs or organizations: Not on file    Relationship status: Not on file  . Intimate  partner violence:    Fear of current or ex partner: Not on file    Emotionally abused: Not on file    Physically abused: Not on file    Forced sexual activity: Not on file  Other Topics Concern  . Not on file  Social History Narrative   Works at [employer]    divorced   No children    Pets: 1 cat live inside   Caffeine- Coffee and soda (once weekly) 1-2 cups     Review of Systems: See HPI, otherwise negative ROS  Physical Exam: BP 93/73   Pulse 80   Temp (!) 96.5 F (35.8 C) (Tympanic)   Resp 20   Ht 5\' 5"  (1.651 m)   Wt 71.2 kg   LMP 03/05/2018 Comment: neg preg test  SpO2 100%   BMI 26.13 kg/m  General:   Alert,  pleasant and cooperative in NAD Head:  Normocephalic and atraumatic. Neck:  Supple; no masses or thyromegaly. Lungs:  Clear throughout to auscultation, normal respiratory effort.    Heart:  +S1, +S2, Regular rate and rhythm, No edema. Abdomen:  Soft,  nontender and nondistended. Normal bowel sounds, without guarding, and without rebound.   Neurologic:  Alert and  oriented x4;  grossly normal neurologically.  Impression/Plan: Emera Bussie is here for an colonoscopy to be performed for constipation. Risks, benefits, limitations, and alternatives regarding  colonoscopy have been reviewed with the patient.  Questions have been answered.  All parties agreeable.   Jonathon Bellows, MD  03/22/2018, 10:04 AM

## 2018-03-22 NOTE — Anesthesia Preprocedure Evaluation (Addendum)
Anesthesia Evaluation  Patient identified by MRN, date of birth, ID band Patient awake    Reviewed: Allergy & Precautions, H&P , NPO status , Patient's Chart, lab work & pertinent test results  Airway Mallampati: II  TM Distance: >3 FB Neck ROM: full    Dental  (+) Teeth Intact   Pulmonary neg pulmonary ROS,    breath sounds clear to auscultation       Cardiovascular negative cardio ROS   Rhythm:regular Rate:Normal     Neuro/Psych negative neurological ROS  negative psych ROS   GI/Hepatic Neg liver ROS, constipation   Endo/Other  negative endocrine ROS  Renal/GU negative Renal ROS  negative genitourinary   Musculoskeletal   Abdominal   Peds  Hematology negative hematology ROS (+)   Anesthesia Other Findings Past Medical History: No date: Anxiety No date: Constipation No date: Depression  Past Surgical History: 2001: WISDOM TOOTH EXTRACTION  BMI    Body Mass Index:  26.13 kg/m      Reproductive/Obstetrics negative OB ROS                            Anesthesia Physical Anesthesia Plan  ASA: I  Anesthesia Plan: General   Post-op Pain Management:    Induction:   PONV Risk Score and Plan: 2 and Propofol infusion and TIVA  Airway Management Planned:   Additional Equipment:   Intra-op Plan:   Post-operative Plan:   Informed Consent: I have reviewed the patients History and Physical, chart, labs and discussed the procedure including the risks, benefits and alternatives for the proposed anesthesia with the patient or authorized representative who has indicated his/her understanding and acceptance.   Dental Advisory Given  Plan Discussed with: Anesthesiologist, CRNA and Surgeon  Anesthesia Plan Comments:        Anesthesia Quick Evaluation

## 2018-03-23 NOTE — Telephone Encounter (Signed)
Results viewed via mychart 03/05/18

## 2018-03-26 ENCOUNTER — Encounter: Payer: Self-pay | Admitting: Gastroenterology

## 2018-04-20 ENCOUNTER — Encounter: Payer: Self-pay | Admitting: Family

## 2018-05-13 ENCOUNTER — Other Ambulatory Visit: Payer: Self-pay | Admitting: Family

## 2018-05-31 ENCOUNTER — Encounter: Payer: Self-pay | Admitting: Family

## 2018-06-01 ENCOUNTER — Telehealth: Payer: Self-pay | Admitting: *Deleted

## 2018-06-01 NOTE — Telephone Encounter (Signed)
Copied from [facility] [phone] [phone]. Topic: Appointment Scheduling - Scheduling Inquiry for Clinic >> Jun 01, 2018 11:57 AM Ahmed Prima L wrote: Reason for CRM: Patient was called to re-schedule her physical with Mable Paris on 67/22 due to a conflict with Mar[facility]'s schedule. Patient advised that this is the second time this appt has been cancelled and very much would like to be worked back in before January. She is asking for a Wednesday morning. Please contact patient

## 2018-06-06 ENCOUNTER — Inpatient Hospital Stay (HOSPITAL_COMMUNITY): Admit: 2018-06-06 | Payer: Self-pay

## 2018-06-06 ENCOUNTER — Encounter: Payer: Self-pay | Admitting: Family

## 2018-06-06 ENCOUNTER — Other Ambulatory Visit (HOSPITAL_COMMUNITY)
Admission: RE | Admit: 2018-06-06 | Discharge: 2018-06-06 | Disposition: A | Discharge status: Home / Self Care | Payer: 59 | Source: Ambulatory Visit | Attending: Family | Admitting: Family

## 2018-06-06 ENCOUNTER — Ambulatory Visit (INDEPENDENT_AMBULATORY_CARE_PROVIDER_SITE_OTHER): Payer: 59 | Admitting: Family

## 2018-06-06 VITALS — BP 118/80 | HR 83 | Temp 98.4°F | Resp 16 | Ht 65.0 in | Wt 159.0 lb

## 2018-06-06 DIAGNOSIS — Z0000 Encounter for general adult medical examination without abnormal findings: Secondary | ICD-10-CM | POA: Diagnosis present

## 2018-06-06 DIAGNOSIS — R8789 Other abnormal findings in specimens from female genital organs: Secondary | ICD-10-CM | POA: Insufficient documentation

## 2018-06-06 DIAGNOSIS — R87618 Other abnormal cytological findings on specimens from cervix uteri: Secondary | ICD-10-CM | POA: Insufficient documentation

## 2018-06-06 NOTE — Patient Instructions (Addendum)
Call Dr Georgeann Oppenheim office and see when would recommend to start screening for colonoscopy-do you start at average risk age at 21 -- OR start at 34 years of age?  Let me know if your left breast tenderness continues as I would advise mammogram. Please let me know and certainly if new symptoms.   Health Maintenance, Female Adopting a healthy lifestyle and getting preventive care can go a long way to promote health and wellness. Talk with your health care provider about what schedule of regular examinations is right for you. This is a good chance for you to check in with your provider about disease prevention and staying healthy. In between checkups, there are plenty of things you can do on your own. Experts have done a lot of research about which lifestyle changes and preventive measures are most likely to keep you healthy. Ask your health care provider for more information. Weight and diet Eat a healthy diet  Be sure to include plenty of vegetables, fruits, low-fat dairy products, and lean protein.  Do not eat a lot of foods high in solid fats, added sugars, or salt.  Get regular exercise. This is one of the most important things you can do for your health. ? Most adults should exercise for at least 150 minutes each week. The exercise should increase your heart rate and make you sweat (moderate-intensity exercise). ? Most adults should also do strengthening exercises at least twice a week. This is in addition to the moderate-intensity exercise.  Maintain a healthy weight  Body mass index (BMI) is a measurement that can be used to identify possible weight problems. It estimates body fat based on height and weight. Your health care provider can help determine your BMI and help you achieve or maintain a healthy weight.  For females 25 years of age and older: ? A BMI below 18.5 is considered underweight. ? A BMI of 18.5 to 24.9 is normal. ? A BMI of 25 to 29.9 is considered overweight. ? A BMI of 30  and above is considered obese.  Watch levels of cholesterol and blood lipids  You should start having your blood tested for lipids and cholesterol at 34 years of age, then have this test every 5 years.  You may need to have your cholesterol levels checked more often if: ? Your lipid or cholesterol levels are high. ? You are older than 34 years of age. ? You are at high risk for heart disease.  Cancer screening Lung Cancer  Lung cancer screening is recommended for adults 27-29 years old who are at high risk for lung cancer because of a history of smoking.  A yearly low-dose CT scan of the lungs is recommended for people who: ? Currently smoke. ? Have quit within the past 15 years. ? Have at least a 30-pack-year history of smoking. A pack year is smoking an average of one pack of cigarettes a day for 1 year.  Yearly screening should continue until it has been 15 years since you quit.  Yearly screening should stop if you develop a health problem that would prevent you from having lung cancer treatment.  Breast Cancer  Practice breast self-awareness. This means understanding how your breasts normally appear and feel.  It also means doing regular breast self-exams. Let your health care provider know about any changes, no matter how small.  If you are in your 20s or 30s, you should have a clinical breast exam (CBE) by a health care provider every  1-3 years as part of a regular health exam.  If you are 23 or older, have a CBE every year. Also consider having a breast X-ray (mammogram) every year.  If you have a family history of breast cancer, talk to your health care provider about genetic screening.  If you are at high risk for breast cancer, talk to your health care provider about having an MRI and a mammogram every year.  Breast cancer gene (BRCA) assessment is recommended for women who have family members with BRCA-related cancers. BRCA-related cancers  include: ? Breast. ? Ovarian. ? Tubal. ? Peritoneal cancers.  Results of the assessment will determine the need for genetic counseling and BRCA1 and BRCA2 testing.  Cervical Cancer Your health care provider may recommend that you be screened regularly for cancer of the pelvic organs (ovaries, uterus, and vagina). This screening involves a pelvic examination, including checking for microscopic changes to the surface of your cervix (Pap test). You may be encouraged to have this screening done every 3 years, beginning at age 71.  For women ages 51-65, health care providers may recommend pelvic exams and Pap testing every 3 years, or they may recommend the Pap and pelvic exam, combined with testing for human papilloma virus (HPV), every 5 years. Some types of HPV increase your risk of cervical cancer. Testing for HPV may also be done on women of any age with unclear Pap test results.  Other health care providers may not recommend any screening for nonpregnant women who are considered low risk for pelvic cancer and who do not have symptoms. Ask your health care provider if a screening pelvic exam is right for you.  If you have had past treatment for cervical cancer or a condition that could lead to cancer, you need Pap tests and screening for cancer for at least 20 years after your treatment. If Pap tests have been discontinued, your risk factors (such as having a new sexual partner) need to be reassessed to determine if screening should resume. Some women have medical problems that increase the chance of getting cervical cancer. In these cases, your health care provider may recommend more frequent screening and Pap tests.  Colorectal Cancer  This type of cancer can be detected and often prevented.  Routine colorectal cancer screening usually begins at 34 years of age and continues through 34 years of age.  Your health care provider may recommend screening at an earlier age if you have risk factors  for colon cancer.  Your health care provider may also recommend using home test kits to check for hidden blood in the stool.  A small camera at the end of a tube can be used to examine your colon directly (sigmoidoscopy or colonoscopy). This is done to check for the earliest forms of colorectal cancer.  Routine screening usually begins at age 30.  Direct examination of the colon should be repeated every 5-10 years through 34 years of age. However, you may need to be screened more often if early forms of precancerous polyps or small growths are found.  Skin Cancer  Check your skin from head to toe regularly.  Tell your health care provider about any new moles or changes in moles, especially if there is a change in a mole's shape or color.  Also tell your health care provider if you have a mole that is larger than the size of a pencil eraser.  Always use sunscreen. Apply sunscreen liberally and repeatedly throughout the day.  Protect  yourself by wearing long sleeves, pants, a wide-brimmed hat, and sunglasses whenever you are outside.  Heart disease, diabetes, and high blood pressure  High blood pressure causes heart disease and increases the risk of stroke. High blood pressure is more likely to develop in: ? People who have blood pressure in the high end of the normal range (130-139/85-89 mm Hg). ? People who are overweight or obese. ? People who are African American.  If you are 76-29 years of age, have your blood pressure checked every 3-5 years. If you are 55 years of age or older, have your blood pressure checked every year. You should have your blood pressure measured twice-once when you are at a hospital or clinic, and once when you are not at a hospital or clinic. Record the average of the two measurements. To check your blood pressure when you are not at a hospital or clinic, you can use: ? An automated blood pressure machine at a pharmacy. ? A home blood pressure monitor.  If  you are between 64 years and 74 years old, ask your health care provider if you should take aspirin to prevent strokes.  Have regular diabetes screenings. This involves taking a blood sample to check your fasting blood sugar level. ? If you are at a normal weight and have a low risk for diabetes, have this test once every three years after 34 years of age. ? If you are overweight and have a high risk for diabetes, consider being tested at a younger age or more often. Preventing infection Hepatitis B  If you have a higher risk for hepatitis B, you should be screened for this virus. You are considered at high risk for hepatitis B if: ? You were born in a country where hepatitis B is common. Ask your health care provider which countries are considered high risk. ? Your parents were born in a high-risk country, and you have not been immunized against hepatitis B (hepatitis B vaccine). ? You have HIV or AIDS. ? You use needles to inject street drugs. ? You live with someone who has hepatitis B. ? You have had sex with someone who has hepatitis B. ? You get hemodialysis treatment. ? You take certain medicines for conditions, including cancer, organ transplantation, and autoimmune conditions.  Hepatitis C  Blood testing is recommended for: ? Everyone born from [birth year] through 1967. ? Anyone with known risk factors for hepatitis C.  Sexually transmitted infections (STIs)  You should be screened for sexually transmitted infections (STIs) including gonorrhea and chlamydia if: ? You are sexually active and are younger than 34 years of age. ? You are older than 34 years of age and your health care provider tells you that you are at risk for this type of infection. ? Your sexual activity has changed since you were last screened and you are at an increased risk for chlamydia or gonorrhea. Ask your health care provider if you are at risk.  If you do not have HIV, but are at risk, it may be recommended  that you take a prescription medicine daily to prevent HIV infection. This is called pre-exposure prophylaxis (PrEP). You are considered at risk if: ? You are sexually active and do not regularly use condoms or know the HIV status of your partner(s). ? You take drugs by injection. ? You are sexually active with a partner who has HIV.  Talk with your health care provider about whether you are at high risk of being  infected with HIV. If you choose to begin PrEP, you should first be tested for HIV. You should then be tested every 3 months for as long as you are taking PrEP. Pregnancy  If you are premenopausal and you may become pregnant, ask your health care provider about preconception counseling.  If you may become pregnant, take 400 to 800 micrograms (mcg) of folic acid every day.  If you want to prevent pregnancy, talk to your health care provider about birth control (contraception). Osteoporosis and menopause  Osteoporosis is a disease in which the bones lose minerals and strength with aging. This can result in serious bone fractures. Your risk for osteoporosis can be identified using a bone density scan.  If you are 68 years of age or older, or if you are at risk for osteoporosis and fractures, ask your health care provider if you should be screened.  Ask your health care provider whether you should take a calcium or vitamin D supplement to lower your risk for osteoporosis.  Menopause may have certain physical symptoms and risks.  Hormone replacement therapy may reduce some of these symptoms and risks. Talk to your health care provider about whether hormone replacement therapy is right for you. Follow these instructions at home:  Schedule regular health, dental, and eye exams.  Stay current with your immunizations.  Do not use any tobacco products including cigarettes, chewing tobacco, or electronic cigarettes.  If you are pregnant, do not drink alcohol.  If you are  breastfeeding, limit how much and how often you drink alcohol.  Limit alcohol intake to no more than 1 drink per day for nonpregnant women. One drink equals 12 ounces of beer, 5 ounces of wine, or 1 ounces of hard liquor.  Do not use street drugs.  Do not share needles.  Ask your health care provider for help if you need support or information about quitting drugs.  Tell your health care provider if you often feel depressed.  Tell your health care provider if you have ever been abused or do not feel safe at home. This information is not intended to replace advice given to you by your health care provider. Make sure you discuss any questions you have with your health care provider. Document Released: 02/07/2011 Document Revised: 12/31/2015 Document Reviewed: 04/28/2015 Elsevier Interactive Patient Education  [employer].

## 2018-06-06 NOTE — Assessment & Plan Note (Signed)
  CBE performed. No tenderness on exam. Advised diagnostic mammogram however patient politely declined as symptom is intermittent. She will let me know. She will also let me know regarding colonoscopy repeat.   Note: We discussed relevant and past history of abnormal labs today at length, we decided to order appropriate labs for screening today based on this discussion. Patient was comfortable with this. Patient was advised if any symptoms were to change after today's visit, to notify me as we may always order additional labs.

## 2018-06-06 NOTE — Telephone Encounter (Signed)
Pt is scheduled for appt today at 3pm

## 2018-06-06 NOTE — Progress Notes (Signed)
Subjective:    Patient ID: Stephanie Williams, female    DOB: 12-24-1985, 32 y.o.   MRN: [id number]  CC: Stephanie Williams is a 32 y.o. female who presents today for physical exam.    HPI: Feels well today. No depression, anxiety. No SI/HI.   No new concerns.   Vaginal bleeding has stopped.   Has been following with Dr Harolyn Rutherford , OB. Stopped birth control and vaginal bleeding has stopped. Currently uses condoms. Considering IUD.   Colorectal Cancer Screening: colonoscopy 03/2018. Family h./o colon cancer. No discussion of when to repeat colonoscopy per patient.  Breast Cancer Screening: No early family history.  Has intermittent, infrequent breast pain on left side under axilla for months, unchanged. No masses. Does SBE sporadically. .Tenderness doesn't correlate with menses. No nipple discharge.   Cervical Cancer Screening: Pap done 08/2017; HPV testing was not done. No pelvic pain however does have thick white vaginal discharge.  no vaginal itching, odor.  Declines STD testing today.        Tetanus - utd         Labs: Screening labs today; TSH done 03/2018.  Exercise: Gets regular exercise.  Alcohol use: occsaional                                                                                                                                    Smoking/tobacco use: Nonsmoker.  Regular dental exams: UTD Wears seat belt: Yes. Skin: follows with dermatology once per year. No h/o skin cancer.   HISTORY:  Past Medical History:  Diagnosis Date  . Anxiety   . Constipation   . Depression     Past Surgical History:  Procedure Laterality Date  . COLONOSCOPY WITH PROPOFOL N/A 03/22/2018   Procedure: COLONOSCOPY WITH PROPOFOL;  Surgeon: Jonathon Bellows, MD;  Location: [facility] ENDOSCOPY;  Service: Gastroenterology;  Laterality: N/A;  . WISDOM TOOTH EXTRACTION  2001   Family History  Problem Relation Age of Onset  . Hypertension Father   . Hypertension Maternal Grandmother   . Prostate cancer Maternal  Grandfather   . Colon cancer Paternal Grandfather 5       Colon Cancer  . Breast cancer Other       ALLERGIES: Patient has no known allergies.  Current Outpatient Medications on File Prior to Visit  Medication Sig Dispense Refill  . citalopram (CELEXA) 40 MG tablet TAKE 1 TABLET BY MOUTH EVERY DAY 90 tablet 1  . lactulose (CEPHULAC) 10 g packet Take 1 packet (10 g total) by mouth 3 (three) times daily as needed (constipation). 30 each 0  . Multiple Vitamins-Minerals (HAIR SKIN AND NAILS FORMULA PO) Take 1 tablet by mouth daily.     . Omega-3 1000 MG CAPS Take 1 capsule by mouth daily.     . polyethylene glycol (MIRALAX / GLYCOLAX) packet Take 17 g by mouth daily as needed for mild constipation.     No  current facility-administered medications on file prior to visit.     Social History   Tobacco Use  . Smoking status: Never Smoker  . Smokeless tobacco: Never Used  Substance Use Topics  . Alcohol use: Yes    Alcohol/week: 3.0 standard drinks    Types: 3 Glasses of wine per week  . Drug use: No    Review of Systems  Constitutional: Negative for chills, fever and unexpected weight change.  HENT: Negative for congestion.   Respiratory: Negative for cough.   Cardiovascular: Negative for chest pain, palpitations and leg swelling.  Gastrointestinal: Negative for abdominal pain, nausea and vomiting.  Genitourinary: Positive for vaginal discharge (thick white). Negative for dysuria, genital sores, hematuria, vaginal bleeding and vaginal pain.  Musculoskeletal: Negative for arthralgias and myalgias.  Skin: Negative for rash.  Neurological: Negative for headaches.  Hematological: Negative for adenopathy.  Psychiatric/Behavioral: Negative for confusion.      Objective:    BP 118/80 (BP Location: Left Arm, Patient Position: Sitting, Cuff Size: Normal)   Pulse 83   Temp 98.4 F (36.9 C) (Oral)   Resp 16   Ht 5\' 5"  (1.651 m)   Wt 159 lb (72.1 kg)   LMP 05/23/2018   SpO2 98%    BMI 26.46 kg/m   BP Readings from Last 3 Encounters:  06/06/18 118/80  03/22/18 (!) 124/111  03/13/18 125/88   Wt Readings from Last 3 Encounters:  06/06/18 159 lb (72.1 kg)  03/22/18 157 lb (71.2 kg)  03/13/18 157 lb (71.2 kg)    Physical Exam  Constitutional: She appears well-developed and well-nourished.  Eyes: Conjunctivae are normal.  Neck: No thyroid mass and no thyromegaly present.  Cardiovascular: Normal rate, regular rhythm, normal heart sounds and normal pulses.  Pulmonary/Chest: Effort normal and breath sounds normal. She has no wheezes. She has no rhonchi. She has no rales. Right breast exhibits no inverted nipple, no mass, no nipple discharge, no skin change and no tenderness. Left breast exhibits no inverted nipple, no mass, no nipple discharge, no skin change and no tenderness. Breasts are symmetrical.  No masses or asymmetry appreciated during CBE.  Specifically, no tenderness noted left breast during exam.   Genitourinary: Uterus is not enlarged, not fixed and not tender. Cervix exhibits no motion tenderness, no discharge and no friability. Right adnexum displays no mass, no tenderness and no fullness. Left adnexum displays no mass, no tenderness and no fullness.  Genitourinary Comments: Pap performed. No CMT. Unable to appreciated ovaries.  Lymphadenopathy:       Head (right side): No submental, no submandibular, no tonsillar, no preauricular, no posterior auricular and no occipital adenopathy present.       Head (left side): No submental, no submandibular, no tonsillar, no preauricular, no posterior auricular and no occipital adenopathy present.       Right cervical: No superficial cervical, no deep cervical and no posterior cervical adenopathy present.      Left cervical: No superficial cervical, no deep cervical and no posterior cervical adenopathy present.    She has no axillary adenopathy.       Right axillary: No pectoral and no lateral adenopathy present.        Left axillary: No pectoral and no lateral adenopathy present. Neurological: She is alert.  Skin: Skin is warm and dry.  Psychiatric: She has a normal mood and affect. Her speech is normal and behavior is normal. Thought content normal.  Vitals reviewed.      Assessment & Plan:  Problem List Items Addressed This Visit      Other   Routine general medical examination at a health care facility - Primary     CBE performed. No tenderness on exam. Advised diagnostic mammogram however patient politely declined as symptom is intermittent. She will let me know. She will also let me know regarding colonoscopy repeat.   Note: We discussed relevant and past history of abnormal labs today at length, we decided to order appropriate labs for screening today based on this discussion. Patient was comfortable with this. Patient was advised if any symptoms were to change after today's visit, to notify me as we may always order additional labs.       Relevant Orders   Cytology - PAP   CBC with Differential/Platelet   Comprehensive metabolic panel   VITAMIN D 25 Hydroxy (Vit-D Deficiency, Fractures)       I have discontinued Miss Oreatha Schrum's doxycycline and SPRINTEC 28. I am also having her maintain her Omega-3, Multiple Vitamins-Minerals (HAIR SKIN AND NAILS FORMULA PO), citalopram, polyethylene glycol, and lactulose.   No orders of the defined types were placed in this encounter.   Return precautions given.   Risks, benefits, and alternatives of the medications and treatment plan prescribed today were discussed, and patient expressed understanding.   Education regarding symptom management and diagnosis given to patient on AVS.   Continue to follow with Burnard Hawthorne, FNP for routine health maintenance.   Rodolph Bong and I agreed with plan.   Mable Paris, FNP

## 2018-06-07 LAB — COMPREHENSIVE METABOLIC PANEL
ALBUMIN: 4.8 g/dL (ref 3.5–5.2)
ALK PHOS: 45 U/L (ref 39–117)
ALT: 15 U/L (ref 0–35)
AST: 18 U/L (ref 0–37)
BUN: 8 mg/dL (ref 6–23)
CALCIUM: 9.6 mg/dL (ref 8.4–10.5)
CHLORIDE: 102 meq/L (ref 96–112)
CO2: 28 mEq/L (ref 19–32)
Creatinine, Ser: 0.69 mg/dL (ref 0.40–1.20)
GFR: 103.18 mL/min (ref >60.00)
Glucose, Bld: 76 mg/dL (ref 70–99)
Potassium: 3.7 mEq/L (ref 3.5–5.1)
Sodium: 139 mEq/L (ref 135–145)
Total Bilirubin: 0.5 mg/dL (ref 0.2–1.2)
Total Protein: 7.3 g/dL (ref 6.0–8.3)

## 2018-06-07 LAB — VITAMIN D 25 HYDROXY (VIT D DEFICIENCY, FRACTURES): VITD: 30.38 ng/mL (ref 30.00–100.00)

## 2018-06-07 LAB — CBC WITH DIFFERENTIAL/PLATELET
Basophils Absolute: 0.1 10*3/uL (ref 0.0–0.1)
Basophils Relative: 1 % (ref 0.0–3.0)
EOS PCT: 1 % (ref 0.0–5.0)
Eosinophils Absolute: 0 10*3/uL (ref 0.0–0.7)
HCT: 39.7 % (ref 36.0–46.0)
HEMOGLOBIN: 13.7 g/dL (ref 12.0–15.0)
Lymphocytes Relative: 30.9 % (ref 12.0–46.0)
Lymphs Abs: 1.5 10*3/uL (ref 0.7–4.0)
MCHC: 34.5 g/dL (ref 30.0–36.0)
MCV: 93.7 fl (ref 78.0–100.0)
MONO ABS: 0.3 10*3/uL (ref 0.1–1.0)
MONOS PCT: 6.8 % (ref 3.0–12.0)
Neutro Abs: 2.9 10*3/uL (ref 1.4–7.7)
Neutrophils Relative %: 60.3 % (ref 43.0–77.0)
Platelets: 295 10*3/uL (ref 150.0–400.0)
RBC: 4.24 Mil/uL (ref 3.87–5.11)
RDW: 12 % (ref 11.5–15.5)
WBC: 4.9 10*3/uL (ref 4.0–10.5)

## 2018-06-11 LAB — CYTOLOGY - PAP
BACTERIAL VAGINITIS: NEGATIVE
CANDIDA VAGINITIS: NEGATIVE
DIAGNOSIS: NEGATIVE
HERPES (WINDOWPATH): NEGATIVE
HPV (WINDOPATH): DETECTED — AB
HPV 16/18/45 GENOTYPING: NEGATIVE

## 2018-06-12 ENCOUNTER — Encounter: Payer: Self-pay | Admitting: Family

## 2018-06-14 ENCOUNTER — Encounter: Payer: Self-pay | Admitting: Obstetrics & Gynecology

## 2018-06-14 ENCOUNTER — Ambulatory Visit (INDEPENDENT_AMBULATORY_CARE_PROVIDER_SITE_OTHER): Payer: 59 | Admitting: Obstetrics & Gynecology

## 2018-06-14 VITALS — BP 131/81 | HR 87 | Wt 155.0 lb

## 2018-06-14 DIAGNOSIS — Z7185 Encounter for immunization safety counseling: Secondary | ICD-10-CM

## 2018-06-14 DIAGNOSIS — Z113 Encounter for screening for infections with a predominantly sexual mode of transmission: Secondary | ICD-10-CM | POA: Diagnosis not present

## 2018-06-14 DIAGNOSIS — R87618 Other abnormal cytological findings on specimens from cervix uteri: Secondary | ICD-10-CM

## 2018-06-14 DIAGNOSIS — Z7189 Other specified counseling: Secondary | ICD-10-CM

## 2018-06-14 DIAGNOSIS — R8789 Other abnormal findings in specimens from female genital organs: Secondary | ICD-10-CM | POA: Diagnosis not present

## 2018-06-14 NOTE — Patient Instructions (Signed)
HPV (Human Papillomavirus) Vaccine: What You Need to Know 1. Why get vaccinated? HPV vaccine prevents infection with human papillomavirus (HPV) types that are associated with many cancers, including:  cervical cancer in females,  vaginal and vulvar cancers in females,  anal cancer in females and males,  throat cancer in females and males, and  penile cancer in males.  In addition, HPV vaccine prevents infection with HPV types that cause genital warts in both females and males. In the [place] about 12,000 women get cervical cancer every year, and about 4,000 women die from it. HPV vaccine can prevent most of these cases of cervical cancer. Vaccination is not a substitute for cervical cancer screening. This vaccine does not protect against all HPV types that can cause cervical cancer. Women should still get regular Pap tests. HPV infection usually comes from sexual contact, and most people will become infected at some point in their life. About 14 million Americans, including teens, get infected every year. Most infections will go away on their own and not cause serious problems. But thousands of women and men get cancer and other diseases from HPV. 2. HPV vaccine HPV vaccine is approved by FDA and is recommended by CDC for both males and females. It is routinely given at 80 or 34 years of age, but it may be given beginning at age 17 years through age 9 years. Most adolescents 9 through 34 years of age should get HPV vaccine as a two-dose series with the doses separated by 6-12 months. People who start HPV vaccination at 10 years of age and older should get the vaccine as a three-dose series with the second dose given 1-2 months after the first dose and the third dose given 6 months after the first dose. There are several exceptions to these age recommendations. Your health care provider can give you more information. 3. Some people should not get this vaccine  Anyone who has had a severe  (life-threatening) allergic reaction to a dose of HPV vaccine should not get another dose.  Anyone who has a severe (life threatening) allergy to any component of HPV vaccine should not get the vaccine.  Tell your doctor if you have any severe allergies that you know of, including a severe allergy to yeast.  HPV vaccine is not recommended for pregnant women. If you learn that you were pregnant when you were vaccinated, there is no reason to expect any problems for you or your baby. Any woman who learns she was pregnant when she got HPV vaccine is encouraged to contact the manufacturer's registry for HPV vaccination during pregnancy at (424)122-5857. Women who are breastfeeding may be vaccinated.  If you have a mild illness, such as a cold, you can probably get the vaccine today. If you are moderately or severely ill, you should probably wait until you recover. Your doctor can advise you. 4. Risks of a vaccine reaction With any medicine, including vaccines, there is a chance of side effects. These are usually mild and go away on their own, but serious reactions are also possible. Most people who get HPV vaccine do not have any serious problems with it. Mild or moderate problems following HPV vaccine:  Reactions in the arm where the shot was given: ? Soreness (about 9 people in 10) ? Redness or swelling (about 1 person in 3)  Fever: ? Mild (100F) (about 1 person in 10) ? Moderate (102F) (about 1 person in 26)  Other problems: ? Headache (about 1 person  in 3) Problems that could happen after any injected vaccine:  People sometimes faint after a medical procedure, including vaccination. Sitting or lying down for about 15 minutes can help prevent fainting, and injuries caused by a fall. Tell your doctor if you feel dizzy, or have vision changes or ringing in the ears.  Some people get severe pain in the shoulder and have difficulty moving the arm where a shot was given. This happens very  rarely.  Any medication can cause a severe allergic reaction. Such reactions from a vaccine are very rare, estimated at about 1 in a million doses, and would happen within a few minutes to a few hours after the vaccination. As with any medicine, there is a very remote chance of a vaccine causing a serious injury or death. The safety of vaccines is always being monitored. For more information, visit: http://www.aguilar.org/. 5. What if there is a serious reaction? What should I look for? Look for anything that concerns you, such as signs of a severe allergic reaction, very high fever, or unusual behavior. Signs of a severe allergic reaction can include hives, swelling of the face and throat, difficulty breathing, a fast heartbeat, dizziness, and weakness. These would usually start a few minutes to a few hours after the vaccination. What should I do? If you think it is a severe allergic reaction or other emergency that can't wait, call 9-1-1 or get to the nearest hospital. Otherwise, call your doctor. Afterward, the reaction should be reported to the Vaccine Adverse Event Reporting System (VAERS). Your doctor should file this report, or you can do it yourself through the VAERS web site at www.vaers.SamedayNews.es, or by calling 360-865-5652. VAERS does not give medical advice. 6. The National Vaccine Injury Compensation Program The [employer] Vaccine Injury Compensation Program (VICP) is a federal program that was created to compensate people who may have been injured by certain vaccines. Persons who believe they may have been injured by a vaccine can learn about the program and about filing a claim by calling 514-438-5491 or visiting the [employer] website at GoldCloset.com.ee. There is a time limit to file a claim for compensation. 7. How can I learn more?  Ask your health care provider. He or she can give you the vaccine package insert or suggest other sources of information.  Call your  local or state health department.  Contact the Centers for Disease Control and Prevention (CDC): ? Call 412-510-9684 (1-800-CDC-INFO) or ? Visit CDC's website at http://sweeney-todd.com/ Vaccine Information Statement, HPV Vaccine (07/10/2015) This information is not intended to replace advice given to you by your health care provider. Make sure you discuss any questions you have with your health care provider. Document Released: 02/19/2014 Document Revised: 04/14/2016 Document Reviewed: 04/14/2016 Elsevier Interactive Patient Education  2017 Elsevier Inc.  Preventing Cervical Cancer Cervical cancer is cancer that grows on the cervix. The cervix is at the bottom of the uterus. It connects the uterus to the vagina. The uterus is where a baby develops during pregnancy. Cancer occurs when cells become abnormal and start to grow out of control. Cervical cancer grows slowly and may not cause any symptoms at first. Over time, the cancer can grow deep into the cervix tissue and spread to other areas. If it is found early, cervical cancer can be treated effectively. You can also take steps to prevent this type of cancer. Most cases of cervical cancer are caused by an STI (sexually transmitted infection) called human papillomavirus (HPV). One way to reduce  your risk of cervical cancer is to avoid infection with the HPV virus. You can do this by practicing safe sex and by getting the HPV vaccine. Getting regular Pap tests is also important because this can help identify changes in cells that could lead to cancer. Your chances of getting this disease can also be reduced by making certain lifestyle changes. How can I protect myself from cervical cancer? Preventing HPV infection  Ask your health care provider about getting the HPV vaccine. If you are 67 years old or younger, you may need to get this vaccine, which is given in three doses over 6 months. This vaccine protects against the types of HPV that could cause  cancer.  Limit the number of people you have sex with. Also avoid having sex with people who have had many sex partners.  Use a latex condom during sex. Getting Pap tests  Get Pap tests regularly, starting at age 83. Talk with your health care provider about how often you need these tests. ? Most women who are 3?34 years of age should have a Pap test every 3 years. ? Most women who are 62?34 years of age should have a Pap test in combination with an HPV test every 5 years. ? Women with a higher risk of cervical cancer, such as those with a weakened immune system or those who have been exposed to the drug diethylstilbestrol (DES), may need more frequent testing. Making other lifestyle changes  Do not use any products that contain nicotine or tobacco, such as cigarettes and e-cigarettes. If you need help quitting, ask your health care provider.  Eat at least 5 servings of fruits and vegetables every day.  Lose weight if you are overweight. Why are these changes important?  These changes and screening tests are designed to address the factors that are known to increase the risk of cervical cancer. Taking these steps is the best way to reduce your risk.  Having regular Pap tests will help identify changes in cells that could lead to cancer. Steps can then be taken to prevent cancer from developing.  These changes will also help find cervical cancer early. This type of cancer can be treated effectively if it is found early. It can be more dangerous and difficult to treat if cancer has grown deep into your cervix or has spread.  In addition to making you less likely to get cervical cancer, these changes will also provide other health benefits, such as the following: ? Practicing safe sex is important for preventing STIs and unplanned pregnancies. ? Avoiding tobacco can reduce your risk for other cancers and health issues. ? Eating a healthy diet and maintaining a healthy weight are good for  your overall health. What can happen if changes are not made? In the early stages, cervical cancer might not have any symptoms. It can take many years for the cancer to grow and get deep into the cervix tissue. This may be happening without you knowing about it. If you develop any symptoms, such as pelvic pain or unusual discharge or bleeding from your vagina, you should see your health care provider right away. If cervical cancer is not found early, you might need treatments such as radiation, chemotherapy, or surgery. In some cases, surgery may mean that you will not be able to get pregnant or carry a pregnancy to term. Where to find support: Talk with your health care provider, school nurse, or local health department for guidance about screening and  vaccination. Some children and teens may be able to get the HPV vaccine free of charge through the U.S. government's Vaccines for Children [employer]) program. Other places that provide vaccinations include:  Public health clinics. Check with your local health department.  [place], where you would pay only what you can afford. To find one near you, check this website: http://lyons.com/  [place]. These are part of a program for Medicare and Medicaid patients who live in rural areas.  The National Breast and Cervical Cancer Early Detection Program also provides breast and cervical cancer screenings and diagnostic services to low-income, uninsured, and underinsured women. Cervical cancer can be passed down through families. Talk with your health care provider or genetic counselor to learn more about genetic testing for cancer. Where to find more information: Learn more about cervical cancer from:  [employer] of Gynecology: WirelessShades.ch  American Cancer Society: www.cancer.org/cancer/cervicalcancer/  U.S. Centers for Disease Control and Prevention:  ParisianParasols.gl  Summary  Talk with your health care provider about getting the HPV vaccine.  Be sure to get regular Pap tests as recommended by your health care provider.  See your health care provider right away if you have any pelvic pain or unusual discharge or bleeding from your vagina. This information is not intended to replace advice given to you by your health care provider. Make sure you discuss any questions you have with your health care provider. Document Released: 08/09/2015 Document Revised: 03/22/2016 Document Reviewed: 03/22/2016 Elsevier Interactive Patient Education  [employer].

## 2018-06-14 NOTE — Progress Notes (Signed)
GYNECOLOGY OFFICE VISIT NOTE  History:  32 y.o. F here today for to discuss abnormal pap smear result; had pap with normal cytology but positive HRHPV on 06/06/2018. She is worried because she did a lot of research about this and is concerned about cancer. Also feels it could cause abnormal bleeding, discharge and pain; she does not have these symptoms currently. Wants comprehensive STI screen. She denies any other concerns.   Past Medical History:  Diagnosis Date  . Anxiety   . Constipation   . Depression     Past Surgical History:  Procedure Laterality Date  . COLONOSCOPY WITH PROPOFOL N/A 03/22/2018   Procedure: COLONOSCOPY WITH PROPOFOL;  Surgeon: Jonathon Bellows, MD;  Location: [facility] ENDOSCOPY;  Service: Gastroenterology;  Laterality: N/A;  . WISDOM TOOTH EXTRACTION  2001    The following portions of the patient's history were reviewed and updated as appropriate: allergies, current medications, past family history, past medical history, past social history, past surgical history and problem list.   Health Maintenance:  Normal pap and positive HRHPV on 06/06/2018.     Review of Systems:  Pertinent items noted in HPI and remainder of comprehensive ROS otherwise negative.  Objective:  Physical Exam BP 131/81   Pulse 87   Wt 155 lb (70.3 kg)   LMP 05/23/2018   BMI 25.79 kg/m  CONSTITUTIONAL: Well-developed, well-nourished female in no acute distress.  HEENT:  Normocephalic, atraumatic. External right and left ear normal. No scleral icterus.  NECK: Normal range of motion, supple, no masses noted on observation SKIN: Skin is warm and dry. No rash noted. Not diaphoretic. No erythema. No pallor. MUSCULOSKELETAL: Normal range of motion. No edema noted. NEUROLOGIC: Alert and oriented to person, place, and time. Normal muscle tone coordination. No cranial nerve deficit noted. PSYCHIATRIC: Normal mood and affect. Normal behavior. Normal judgment and thought content. CARDIOVASCULAR:  Normal heart rate noted RESPIRATORY: Effort and breath sounds normal, no problems with respiration noted ABDOMEN: Soft, no distention noted.   PELVIC: Deferred  Labs and Imaging No results found for this or any previous visit (from the past 168 hour(s)). No results found.  Assessment & Plan:  1. Pap smear abnormality of cervix/human papillomavirus (HPV) positive, normal cytology Discussed this result in detail, informed patient that pathology lab was called and HPV genotyping for 16, 18/45 was added. If abnormal, will proceed with colposcopy. If negative, will repeat cotesting in one year. Talked to her at length to reduce her anxiety about this; she is very anxious about this. Discussed HPV in detail: its prevalence, consequences, and concern about cervical dysplasia/cancer. Reassured her that her cytology was negative, and she will likely clear this, but that surveillance is still needed. Will follow up results and manage accordingly.  2. Screen for STD (sexually transmitted disease) Comprehensive screening done as per her request, will follow up results and manage accordingly. - Hepatitis C antibody - Cervicovaginal ancillary only (done via self-swab) - HIV Antibody (routine testing w rflx) - RPR - Hepatitis B surface antigen  3. HPV vaccine counseling Counseled about HPV vaccine, how it can be administered up to age 38 now, she wants to make sure her insurance will pay for it prior to getting it. She will call back to set up appointment.  Routine preventative health maintenance measures emphasized. Please refer to After Visit Summary for other counseling recommendations.   Return for any gynecologic concerns.   Total face-to-face time with patient: 25 minutes.  Over 50% of encounter was spent  on counseling and coordination of care.   Verita Schneiders, MD, [place] for [employer], [place]

## 2018-06-15 LAB — CERVICOVAGINAL ANCILLARY ONLY
CHLAMYDIA, DNA PROBE: NEGATIVE
NEISSERIA GONORRHEA: NEGATIVE
Trichomonas: NEGATIVE

## 2018-06-15 LAB — HEPATITIS B SURFACE ANTIGEN: Hepatitis B Surface Ag: NEGATIVE

## 2018-06-15 LAB — RPR: RPR: NONREACTIVE

## 2018-06-15 LAB — HIV ANTIBODY (ROUTINE TESTING W REFLEX): HIV SCREEN 4TH GENERATION: NONREACTIVE

## 2018-06-15 LAB — HEPATITIS C ANTIBODY

## 2018-06-15 NOTE — Telephone Encounter (Unsigned)
Copied from [place] [id number]. Topic: General - Other >> Jun 15, 2018 11:06 AM Carolyn Stare wrote: [name] with the Cytology dept ask that Mable Paris nurse give her a call back   [phone]

## 2018-06-18 ENCOUNTER — Telehealth: Payer: Self-pay

## 2018-06-18 NOTE — Telephone Encounter (Signed)
Copied from [place] [phone]. Topic: General - Other >> Jun 15, 2018 11:06 AM Carolyn Stare wrote: [name] with the Cytology dept ask that Mable Paris nurse give her a call back   [phone] >> Jun 18, 2018  9:36 AM Conception Chancy, NT wrote: [name] is calling back in regards to this and would like a call back as soon as possible.   **Patient added on HPV 1618 as well HSV testing & you will receive notes on both results.**

## 2018-06-18 NOTE — Telephone Encounter (Signed)
Spoke with [facility] Cytology and you are going to be copied on pap report

## 2018-06-18 NOTE — Telephone Encounter (Unsigned)
Copied from [place] [phone]. Topic: General - Other >> Jun 15, 2018 11:06 AM Carolyn Stare wrote: [name] with the Cytology dept ask that Mable Paris nurse give her a call back   [phone] >> Jun 18, 2018  9:36 AM Conception Chancy, NT wrote: [name] is calling back in regards to this and would like a call back as soon as possible.

## 2018-06-18 NOTE — Telephone Encounter (Signed)
Left message for [facility] Cytology to call back

## 2018-06-18 NOTE — Telephone Encounter (Signed)
Cyril Mourning,  Would you call cytology? Im confused .  She  May be referred to cytology ordered by [facility] GYN Dr Harolyn Rutherford whom pt saw 06/14/18

## 2018-06-20 ENCOUNTER — Encounter: Payer: Self-pay | Admitting: Family

## 2018-08-15 ENCOUNTER — Ambulatory Visit (INDEPENDENT_AMBULATORY_CARE_PROVIDER_SITE_OTHER): Payer: 59 | Admitting: Advanced Practice Midwife

## 2018-08-15 ENCOUNTER — Encounter: Payer: Self-pay | Admitting: Advanced Practice Midwife

## 2018-08-15 VITALS — BP 139/84 | HR 81

## 2018-08-15 DIAGNOSIS — Z113 Encounter for screening for infections with a predominantly sexual mode of transmission: Secondary | ICD-10-CM

## 2018-08-15 DIAGNOSIS — Z3009 Encounter for other general counseling and advice on contraception: Secondary | ICD-10-CM | POA: Diagnosis not present

## 2018-08-15 DIAGNOSIS — Z202 Contact with and (suspected) exposure to infections with a predominantly sexual mode of transmission: Secondary | ICD-10-CM

## 2018-08-15 LAB — POCT URINE PREGNANCY: Preg Test, Ur: NEGATIVE

## 2018-08-15 NOTE — Progress Notes (Signed)
GYNECOLOGY OFFICE VISIT NOTE  Subjective:   Stephanie Williams is a 33 y.o. G0P0000 female here for  evaluation of possible STI exposure and contraception counseling. Current complaints: Patient is requesting STI testing due to sexual encounters with multiple partners about 7.5 weeks ago. Patient endorses one episode of unprotected sex whose testing status is unknown. This was followed by a separate sexual encounter with a different partner after which she experienced vaginal bleeding for about one week which resolved without intervention. She denies painful intercourse and bleeding issues after intercourse with her current partner. She denies discharge, pelvic pain, problems with intercourse or other gynecologic concerns.   Patient is interested in restarting birth control, either OCPs or IUD to manage her irregular bleeding.   Gynecologic History No LMP recorded. Contraception: intermittent condom use Last Pap: 06/14/18. Results were:positive HPV, negative for 16 and 18/45 Last mammogram: N/A age 25.   Obstetric History OB History  Gravida Para Term Preterm AB Living  0 0 0 0 0 0  SAB TAB Ectopic Multiple Live Births  0 0 0 0 0    Past Medical History:  Diagnosis Date  . Anxiety   . Constipation   . Depression     Past Surgical History:  Procedure Laterality Date  . COLONOSCOPY WITH PROPOFOL N/A 03/22/2018   Procedure: COLONOSCOPY WITH PROPOFOL;  Surgeon: Jonathon Bellows, MD;  Location: [facility] ENDOSCOPY;  Service: Gastroenterology;  Laterality: N/A;  . WISDOM TOOTH EXTRACTION  2001    Current Outpatient Medications on File Prior to Visit  Medication Sig Dispense Refill  . citalopram (CELEXA) 40 MG tablet TAKE 1 TABLET BY MOUTH EVERY DAY 90 tablet 1  . lactulose (CEPHULAC) 10 g packet Take 1 packet (10 g total) by mouth 3 (three) times daily as needed (constipation). 30 each 0  . Multiple Vitamins-Minerals (HAIR SKIN AND NAILS FORMULA PO) Take 1 tablet by mouth daily.     . Omega-3  1000 MG CAPS Take 1 capsule by mouth daily.     . polyethylene glycol (MIRALAX / GLYCOLAX) packet Take 17 g by mouth daily as needed for mild constipation.     No current facility-administered medications on file prior to visit.     No Known Allergies  Social History:  reports that she has never smoked. She has never used smokeless tobacco. She reports current alcohol use of about 3.0 standard drinks of alcohol per week. She reports that she does not use drugs.  Family History  Problem Relation Age of Onset  . Hypertension Father   . Hypertension Maternal Grandmother   . Prostate cancer Maternal Grandfather   . Colon cancer Paternal Grandfather 31       Colon Cancer  . Breast cancer Other     The following portions of the patient's history were reviewed and updated as appropriate: allergies, current medications, past family history, past medical history, past social history, past surgical history and problem list.  Review of Systems Pertinent items noted in HPI and remainder of comprehensive ROS otherwise negative.   Objective:  BP 139/84   Pulse 81  CONSTITUTIONAL: Well-developed, well-nourished female in no acute distress.  HENT:  Normocephalic, atraumatic, External right and left ear normal. Oropharynx is clear and moist EYES: Conjunctivae and EOM are normal. Pupils are equal, round, and reactive to light. No scleral icterus.  NECK: Normal range of motion, supple, no masses.  Normal thyroid.  SKIN: Skin is warm and dry. No rash noted. Not diaphoretic. No erythema.  No pallor. MUSCULOSKELETAL: Normal range of motion. No tenderness.  No cyanosis, clubbing, or edema.  2+ distal pulses. NEUROLOGIC: Alert and oriented to person, place, and time. Normal reflexes, muscle tone coordination. No cranial nerve deficit noted. PELVIC: Deferred   Assessment and Plan:  1. Sexually transmitted disease exposure - Scant spotting between periods, possible STI exposure - Cervicovaginal  ancillary only( [place]) - Hepatitis B surface antigen - Hepatitis C Antibody - RPR - HIV Antibody (routine testing w rflx)  2. Contraception counseling - Reviewed all forms of birth control options available including abstinence; fertility period awareness methods; over the counter/barrier methods; hormonal contraceptive medication including pill, patch, ring, injection,contraceptive implant; hormonal and nonhormonal IUDs; Risks and benefits reviewed.  Questions were answered.  Patient referred to [email or web address] for additional information  Will follow up results of labs and manage accordingly. Routine preventative health maintenance measures emphasized. Please refer to After Visit Summary for other counseling recommendations.   Patient to return to clinic for repeat Pap on or about October 2020 as previously advised.  Mallie Snooks, [place] for [employer], [place]

## 2018-08-15 NOTE — Progress Notes (Signed)
Having breakthrough bleeding between periods, unsure if periods are regular since coming off [facility]. Pt states that sex activates the bleeding at times.

## 2018-08-16 LAB — HIV ANTIBODY (ROUTINE TESTING W REFLEX): HIV Screen 4th Generation wRfx: NONREACTIVE

## 2018-08-16 LAB — HEPATITIS C ANTIBODY: Hep C Virus Ab: <0.1 s/co ratio (ref 0.0–0.9)

## 2018-08-16 LAB — HEPATITIS B SURFACE ANTIGEN: HEP B S AG: NEGATIVE

## 2018-08-16 LAB — RPR: RPR Ser Ql: NONREACTIVE

## 2018-08-17 ENCOUNTER — Telehealth: Payer: Self-pay | Admitting: *Deleted

## 2018-08-17 LAB — CERVICOVAGINAL ANCILLARY ONLY
Chlamydia: NEGATIVE
Neisseria Gonorrhea: NEGATIVE
TRICH (WINDOWPATH): NEGATIVE

## 2018-08-17 NOTE — Telephone Encounter (Signed)
Pt called in this morning concerned about the symptoms she is having, lower back pain, lower abdomen pain, and different spotting than what she has been having. Concerned with it being the weekend and wanting to get meds, informed her that swab results should hopefully be back today, and that I would send a message to Sam CNM about her symptoms.   Crosby Oyster, RN

## 2018-08-21 ENCOUNTER — Telehealth: Payer: Self-pay | Admitting: *Deleted

## 2018-08-21 MED ORDER — NORGESTIMATE-ETH ESTRADIOL 0.25-35 MG-MCG PO TABS: 1.0000 | ORAL_TABLET | Freq: Every day | ORAL | 11 refills | Status: AC

## 2018-08-21 NOTE — Telephone Encounter (Signed)
Left message of RX being sent in for OCP's.

## 2018-08-29 ENCOUNTER — Other Ambulatory Visit: Payer: Self-pay | Admitting: Family

## 2018-08-29 DIAGNOSIS — F411 Generalized anxiety disorder: Secondary | ICD-10-CM

## 2018-08-29 MED ORDER — CITALOPRAM HYDROBROMIDE 40 MG PO TABS: 40.0000 mg | ORAL_TABLET | Freq: Every day | ORAL | 0 refills | Status: DC

## 2018-08-29 NOTE — Telephone Encounter (Signed)
Copied from [place]. Topic: Quick Communication - Rx Refill/Question >> Aug 29, 2018  9:13 AM Carolyn Stare wrote: Medication   citalopram (CELEXA) 40 MG tablet  Has the patient contacted their pharmacy  yes    Preferred Pharmacy  [facility] [address] [place]   Agent: Please be advised that RX refills may take up to 3 business days. We ask that you follow-up with your pharmacy.

## 2018-09-13 ENCOUNTER — Encounter: Payer: Self-pay | Admitting: Obstetrics & Gynecology

## 2018-09-13 ENCOUNTER — Ambulatory Visit (INDEPENDENT_AMBULATORY_CARE_PROVIDER_SITE_OTHER): Payer: 59 | Admitting: Obstetrics & Gynecology

## 2018-09-13 VITALS — BP 130/83 | HR 72 | Wt 152.6 lb

## 2018-09-13 DIAGNOSIS — N939 Abnormal uterine and vaginal bleeding, unspecified: Secondary | ICD-10-CM

## 2018-09-13 DIAGNOSIS — N390 Urinary tract infection, site not specified: Secondary | ICD-10-CM

## 2018-09-13 DIAGNOSIS — N898 Other specified noninflammatory disorders of vagina: Secondary | ICD-10-CM

## 2018-09-13 DIAGNOSIS — B952 Enterococcus as the cause of diseases classified elsewhere: Secondary | ICD-10-CM

## 2018-09-13 DIAGNOSIS — Z113 Encounter for screening for infections with a predominantly sexual mode of transmission: Secondary | ICD-10-CM

## 2018-09-13 LAB — POCT URINE QUALITATIVE DIPSTICK BLOOD: RBC UA: NEGATIVE

## 2018-09-13 MED ORDER — NORETHINDRONE-ETH ESTRADIOL 1-35 MG-MCG PO TABS: 1.0000 | ORAL_TABLET | Freq: Every day | ORAL | 11 refills | Status: AC

## 2018-09-13 NOTE — Progress Notes (Signed)
GYNECOLOGY OFFICE VISIT NOTE  History:  33 y.o. G0P0000 here today for follow up after visit in 08/2018 for AUB.  She restarted Sprintec given history of months of chronic vaginal spotting, but still has same level of AUB.  She also wants to be checked for vaginitis pathogens. Recently diagnosed with Enterococcus UTI, wants a urine culture to check "all is well". She denies any current abnormal vaginal discharge, pelvic pain or other concerns.   Past Medical History:  Diagnosis Date  . Anxiety   . Constipation   . Depression     Past Surgical History:  Procedure Laterality Date  . COLONOSCOPY WITH PROPOFOL N/A 03/22/2018   Procedure: COLONOSCOPY WITH PROPOFOL;  Surgeon: Jonathon Bellows, MD;  Location: [facility] ENDOSCOPY;  Service: Gastroenterology;  Laterality: N/A;  . WISDOM TOOTH EXTRACTION  2001    The following portions of the patient's history were reviewed and updated as appropriate: allergies, current medications, past family history, past medical history, past social history, past surgical history and problem list.   Health Maintenance:  Normal pap and positive HRHPV (negative for 16 and 18/45) on 06/14/2018.    Review of Systems:  Pertinent items noted in HPI and remainder of comprehensive ROS otherwise negative.  Objective:  Physical Exam BP 130/83   Pulse 72   Wt 152 lb 9.6 oz (69.2 kg)   BMI 25.39 kg/m  CONSTITUTIONAL: Well-developed, well-nourished female in no acute distress.  HEENT:  Normocephalic, atraumatic. External right and left ear normal. No scleral icterus.  NECK: Normal range of motion, supple, no masses noted on observation SKIN: No rash noted. Not diaphoretic. No erythema. No pallor. MUSCULOSKELETAL: Normal range of motion. No edema noted. NEUROLOGIC: Alert and oriented to person, place, and time. Normal muscle tone coordination. No cranial nerve deficit noted. PSYCHIATRIC: Normal mood and affect. Normal behavior. Normal judgment and thought  content. CARDIOVASCULAR: Normal heart rate noted RESPIRATORY: Effort and breath sounds normal, no problems with respiration noted ABDOMEN: No masses noted. No other overt distention noted.   PELVIC: Normal appearing external genitalia; normal appearing distal vaginal mucosa.  Pink discharge noted, sample obtained for testing.  Labs and Imaging Results for orders placed or performed in visit on 09/13/18 (from the past 168 hour(s))  POCT urine qual dipstick blood   Collection Time: 09/13/18 10:58 AM  Result Value Ref Range   Blood, UA NEG    No results found.  Assessment & Plan:  1. Abnormal uterine bleeding (AUB) Given persistent AUB despite Sprintec, patient was told to try taking two pills daily x 7-10 days, then one pill daily. Told to take active pills only for the next two or three packs. If AUB is not controlled on the increased hormonal dosage and there is no vaginitis (had negative pelvic ultrasound recently), will try different formulation with a different progestin (Ortho-Novum has norehindrone) vs Demulen (ethynodiol) which sometimes can have different responses. Patient declines levonogestrel IUD. Will monitor response, follow up in one month. - Cervicovaginal ancillary only - norethindrone-ethinyl estradiol 1/35 (ORTHO-NOVUM, NORTREL,CYCLAFEM) tablet; Take 1 tablet by mouth daily.  Dispense: 1 Package; Refill: 11  2. UTI (urinary tract infection) due to Enterococcus UA was negative, will follow up culture for test of cure. - Urine Culture - POCT urine qual dipstick blood   Please refer to After Visit Summary for other counseling recommendations.   Return in about 1 month (around 10/12/2018) for Followup.   Total face-to-face time with patient: 15 minutes.  Over 50% of encounter  was spent on counseling and coordination of care.   Verita Schneiders, MD, [place] for [employer], [place]

## 2018-09-14 LAB — URINE CULTURE: Organism ID, Bacteria: NO GROWTH

## 2018-09-16 LAB — CERVICOVAGINAL ANCILLARY ONLY
Bacterial vaginitis: NEGATIVE
Candida vaginitis: NEGATIVE
TRICH (WINDOWPATH): NEGATIVE

## 2018-10-05 ENCOUNTER — Telehealth: Payer: Self-pay | Admitting: *Deleted

## 2018-10-05 MED ORDER — NITROFURANTOIN MONOHYD MACRO 100 MG PO CAPS: 100.0000 mg | ORAL_CAPSULE | Freq: Two times a day (BID) | ORAL | 0 refills | Status: AC

## 2018-10-05 MED ORDER — PHENAZOPYRIDINE HCL 200 MG PO TABS: 200.0000 mg | ORAL_TABLET | Freq: Three times a day (TID) | ORAL | 0 refills | Status: AC | PRN

## 2018-10-05 NOTE — Telephone Encounter (Signed)
Pt informed of meds sent to pharmacy

## 2018-10-10 ENCOUNTER — Ambulatory Visit (INDEPENDENT_AMBULATORY_CARE_PROVIDER_SITE_OTHER): Payer: 59 | Admitting: Obstetrics & Gynecology

## 2018-10-10 ENCOUNTER — Encounter: Payer: Self-pay | Admitting: Obstetrics & Gynecology

## 2018-10-10 VITALS — BP 141/87 | HR 72 | Wt 150.0 lb

## 2018-10-10 DIAGNOSIS — N939 Abnormal uterine and vaginal bleeding, unspecified: Secondary | ICD-10-CM

## 2018-10-10 DIAGNOSIS — N921 Excessive and frequent menstruation with irregular cycle: Secondary | ICD-10-CM

## 2018-10-10 MED ORDER — DOXYCYCLINE HYCLATE 100 MG PO CAPS: 100.0000 mg | ORAL_CAPSULE | Freq: Two times a day (BID) | ORAL | 0 refills | Status: AC

## 2018-10-10 MED ORDER — NAPROXEN 500 MG PO TABS: 500.0000 mg | ORAL_TABLET | Freq: Two times a day (BID) | ORAL | 2 refills | Status: AC

## 2018-10-10 MED ORDER — FLUCONAZOLE 150 MG PO TABS: 150.0000 mg | ORAL_TABLET | Freq: Once | ORAL | 3 refills | Status: AC

## 2018-10-10 NOTE — Patient Instructions (Signed)
Return to clinic for any scheduled appointments or for any gynecologic concerns as needed.   

## 2018-10-10 NOTE — Progress Notes (Signed)
GYNECOLOGY OFFICE VISIT NOTE   History:  Stephanie Williams is a 33 y.o. G0 here today for follow up of her chronic spotting/AUB. Was tried on different OCPs, she says the spotting is periodically less but still there.  It is frustrating, she wears pantyliners daily and interferes with her sexual activities.  She denies any abnormal pelvic pain or other concerns.    Past Medical History:  Diagnosis Date  . Anxiety   . Constipation   . Depression     Past Surgical History:  Procedure Laterality Date  . COLONOSCOPY WITH PROPOFOL N/A 03/22/2018   Procedure: COLONOSCOPY WITH PROPOFOL;  Surgeon: Jonathon Bellows, MD;  Location: [facility] ENDOSCOPY;  Service: Gastroenterology;  Laterality: N/A;  . WISDOM TOOTH EXTRACTION  2001    The following portions of the patient's history were reviewed and updated as appropriate: allergies, current medications, past family history, past medical history, past social history, past surgical history and problem list.   Health Maintenance:  Normal pap and positive HRHPV (negative for 16 and 18/45) on 06/14/2018.    Review of Systems:  Pertinent items noted in HPI and remainder of comprehensive ROS otherwise negative.  Physical Exam:  BP (!) 141/87   Pulse 72   Wt 150 lb (68 kg)   BMI 24.96 kg/m  CONSTITUTIONAL: Well-developed, well-nourished female in no acute distress.  HEENT:  Normocephalic, atraumatic. External right and left ear normal. No scleral icterus.  NECK: Normal range of motion, supple, no masses noted on observation SKIN: No rash noted. Not diaphoretic. No erythema. No pallor. MUSCULOSKELETAL: Normal range of motion. No edema noted. NEUROLOGIC: Alert and oriented to person, place, and time. Normal muscle tone coordination. No cranial nerve deficit noted. PSYCHIATRIC: Normal mood and affect. Normal behavior. Normal judgment and thought content. CARDIOVASCULAR: Normal heart rate noted RESPIRATORY: Effort and breath sounds normal, no problems with  respiration noted ABDOMEN: No masses noted. No other overt distention noted.   PELVIC: Deferred       Assessment and Plan:     1. Abnormal uterine bleeding (AUB) 2. Breakthrough bleeding on OCPs Still continued spotting despite multiple OCP regimens, no structural anomaly on exam and ultrasound. Was alleviated for months in 03/2018 after course of antibiotics for chronic endometritis. Will do another 10 day course of antibiotics and NSAIDs, continue OCPs for now.  Still recommending IUD trial or Depo Provera, patient declined at this point. Wants to retain fertility.  If no response, will consider sonohysterography or office hysteroscopy Unknown Jim) to evaluate more directly for intrauterine lesions (she has not had any hysteroscopy; last ultrasound showed thin stripe of 4.8 mm without any focal abnormality). - doxycycline (VIBRAMYCIN) 100 MG capsule; Take 1 capsule (100 mg total) by mouth 2 (two) times daily for 10 days.  Dispense: 20 capsule; Refill: 0 - naproxen (NAPROSYN) 500 MG tablet; Take 1 tablet (500 mg total) by mouth 2 (two) times daily with a meal for 10 days.  Dispense: 20 tablet; Refill: 2 - fluconazole (DIFLUCAN) 150 MG tablet; Take 1 tablet (150 mg total) by mouth once for 1 dose. Can take additional dose three days later if symptoms persist  Dispense: 1 tablet; Refill: 3  Routine preventative health maintenance measures emphasized. Please refer to After Visit Summary for other counseling recommendations.   Return in about 6 weeks (around 11/21/2018) for Followup.    Total face-to-face time with patient: 20 minutes.  Over 50% of encounter was spent on counseling and coordination of care.   Verita Schneiders,  MD, [place], [facility] for [employer], [place]

## 2018-11-21 ENCOUNTER — Other Ambulatory Visit: Payer: Self-pay | Admitting: Family

## 2018-11-21 DIAGNOSIS — F411 Generalized anxiety disorder: Secondary | ICD-10-CM

## 2019-02-13 ENCOUNTER — Other Ambulatory Visit: Payer: Self-pay | Admitting: Family

## 2019-02-13 DIAGNOSIS — F411 Generalized anxiety disorder: Secondary | ICD-10-CM

## 2019-11-07 IMAGING — CR DG ABDOMEN 1V
1 series · 1 of 1 positions shown · non-contrast
Comparison: None.

CLINICAL DATA: Constipation for 2 weeks.  Chronic constipation.

EXAM:
ABDOMEN - 1 VIEW

[dg abd 1 view]
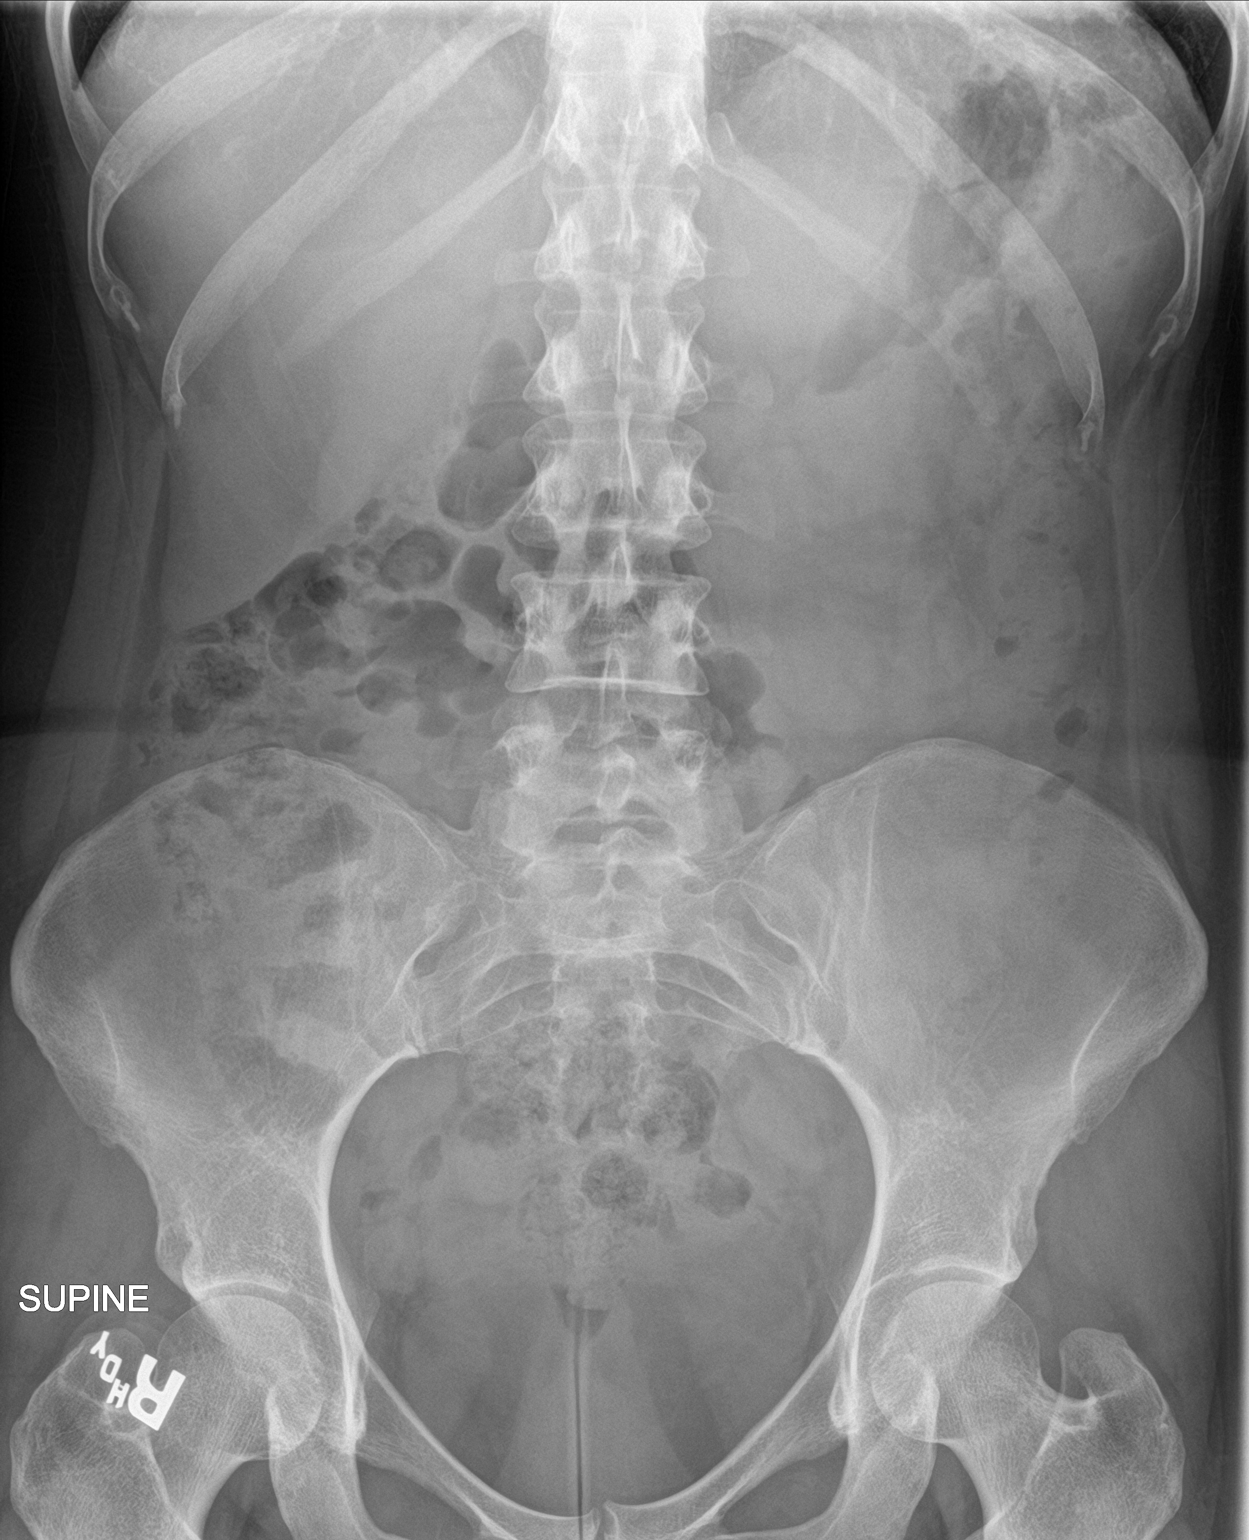

[1 of 1 positions shown; findings below may reference images not displayed]

FINDINGS: The bowel gas pattern is nonobstructive. Colonic stool burden is at
the upper limits of normal. No bowel wall thickening or supine
evidence of free intraperitoneal air. There are no suspicious
abdominal calcifications. The bones appear unremarkable.
IMPRESSION: No acute findings. Colonic stool burden felt to be at the upper
limits of normal.

## 2019-11-17 IMAGING — US US PELVIS COMPLETE TRANSABD/TRANSVAG
1 series · 13 of 25 positions shown · non-contrast
Comparison: None

CLINICAL DATA: Initial evaluation for abnormal vaginal spotting
between menses for 1 year.



[Series 1: us pelvis complete transabd/transvag · 13 of 114 slices shown]
[im 1/114]
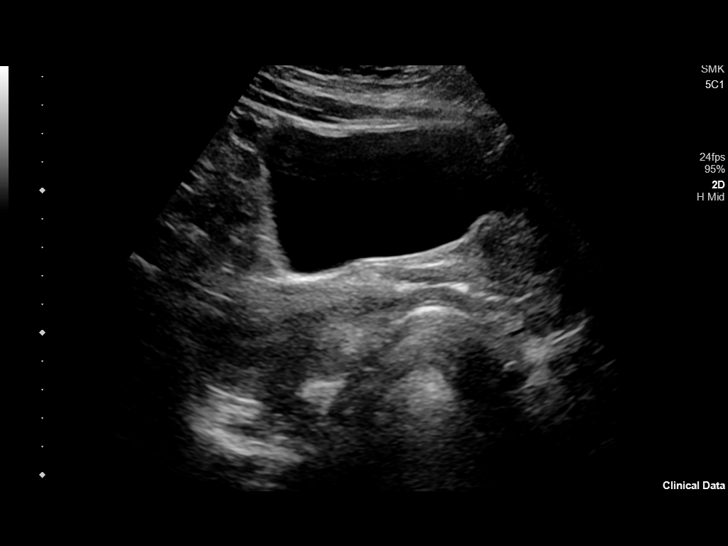
[im 10/114]
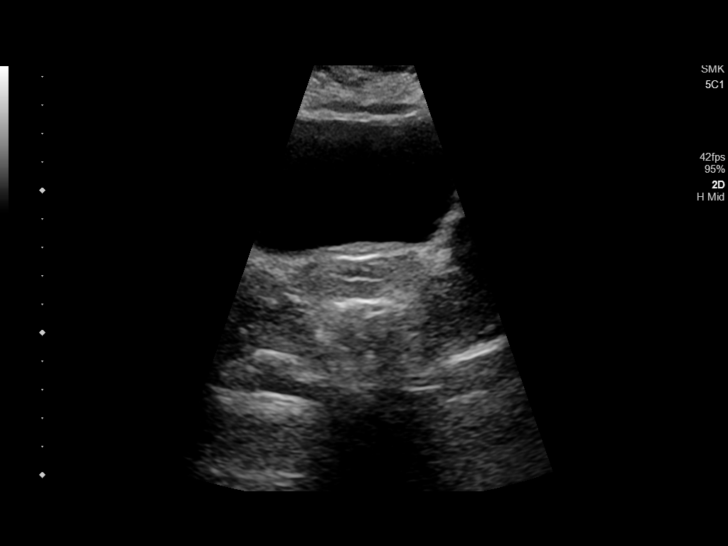
[im 19/114]
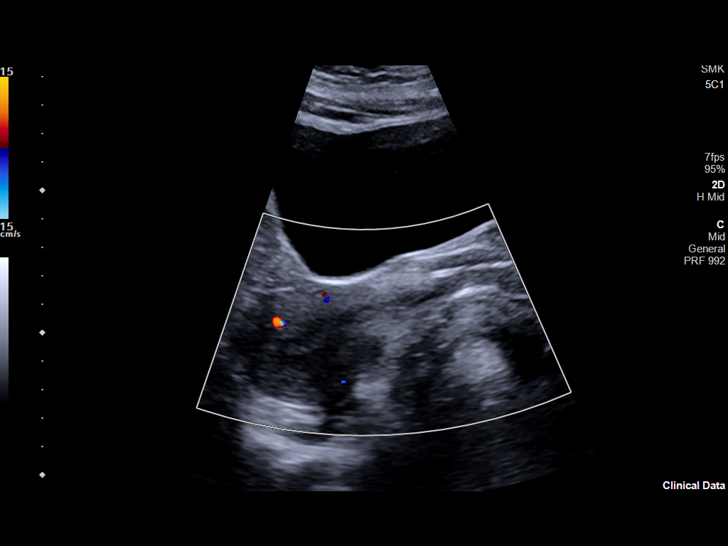
[im 29/114]
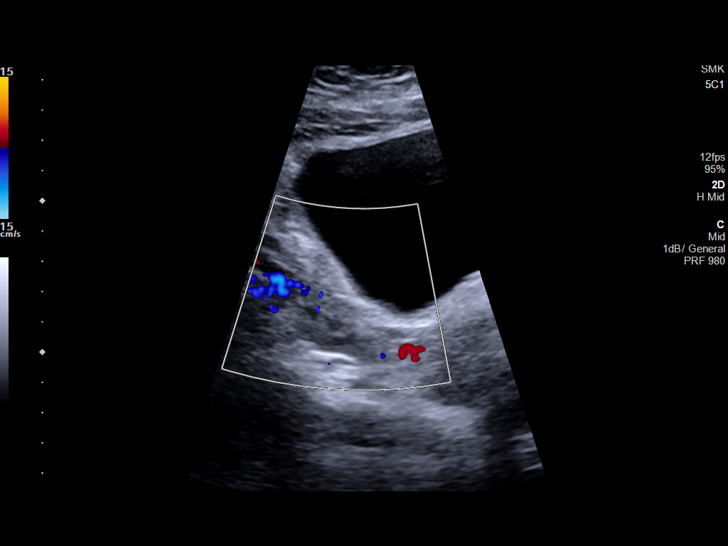
[im 38/114]
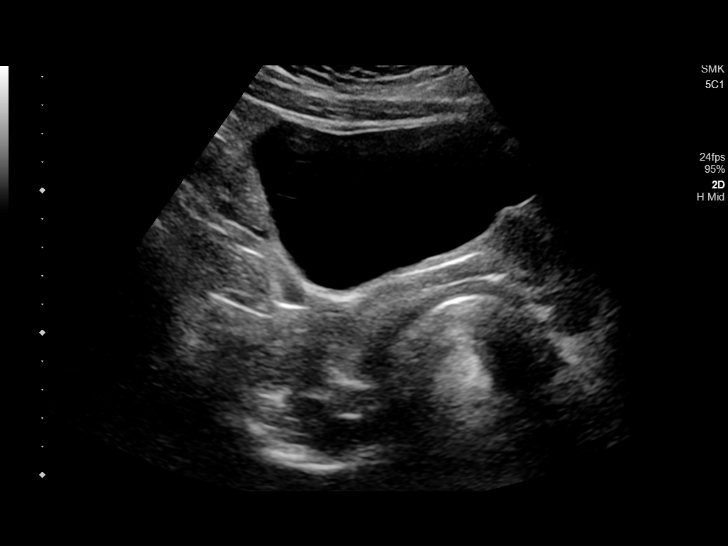
[im 48/114]
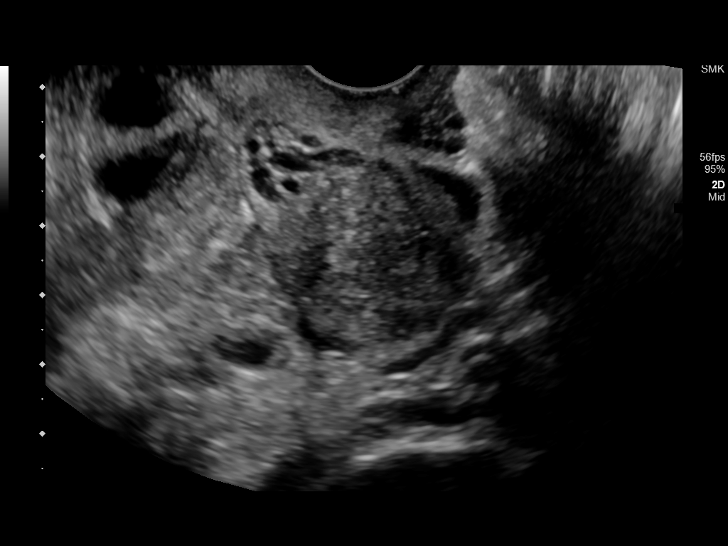
[im 57/114]
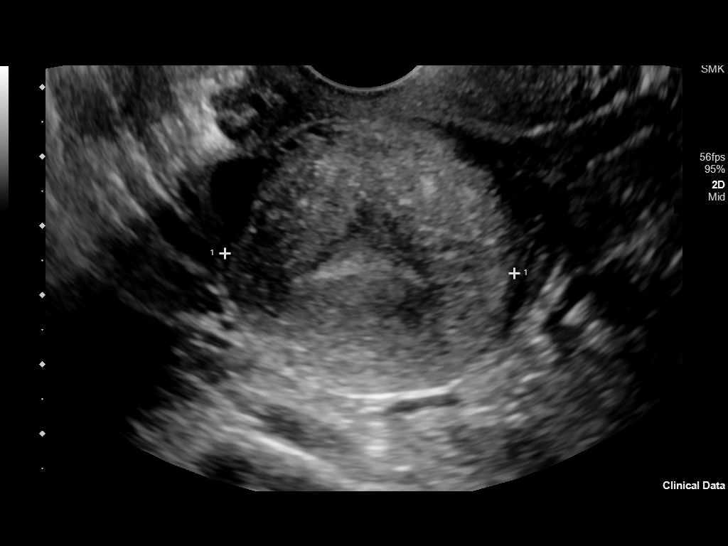
[im 66/114]
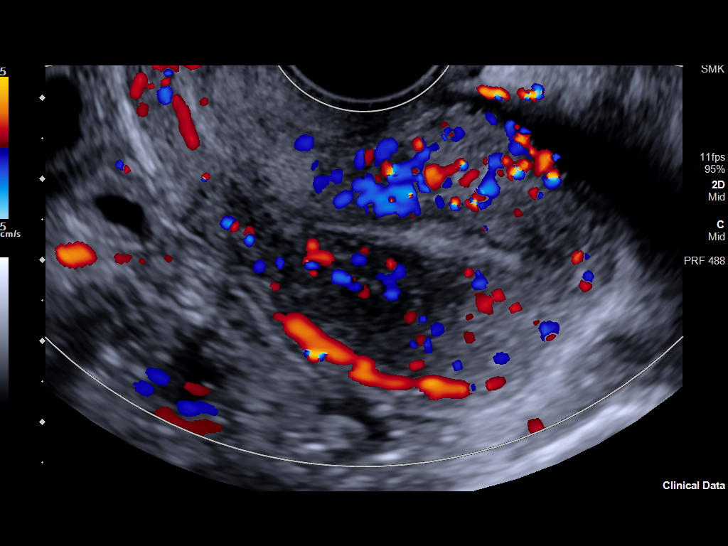
[im 76/114]
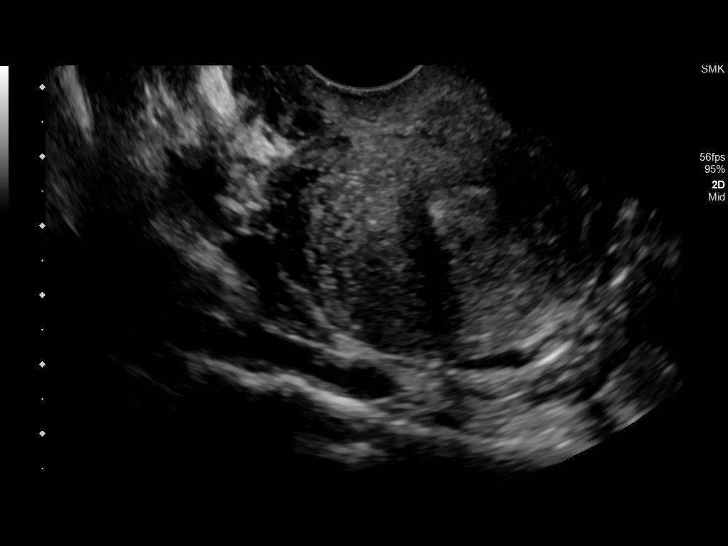
[im 85/114]
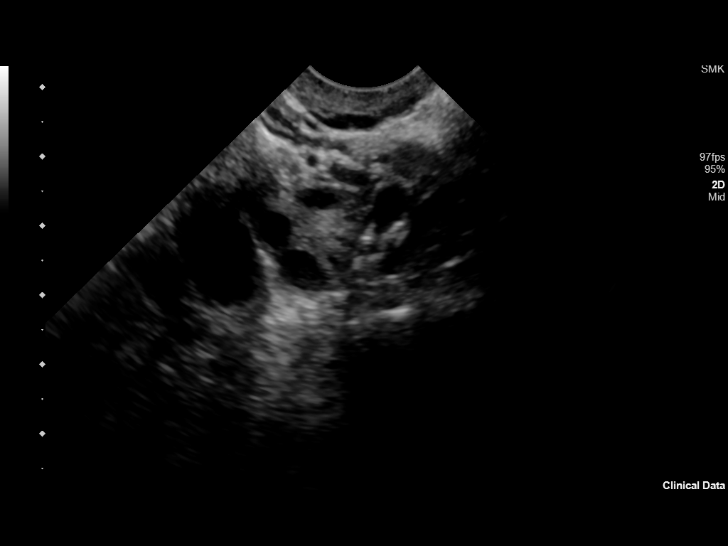
[im 95/114]
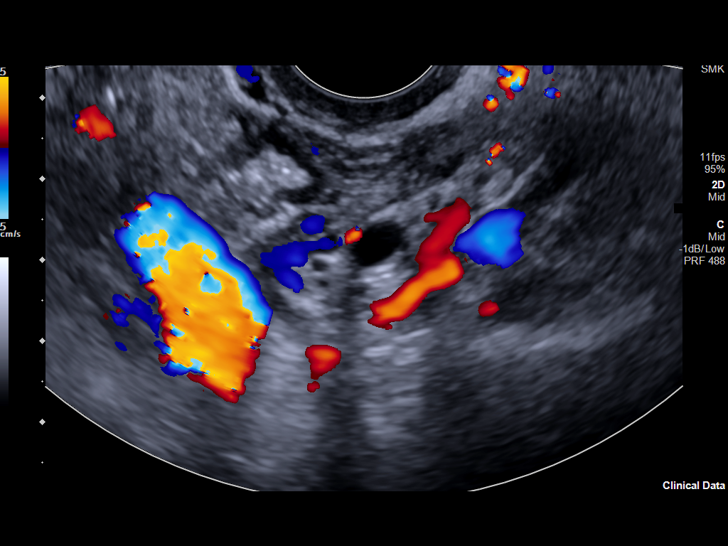
[im 104/114]
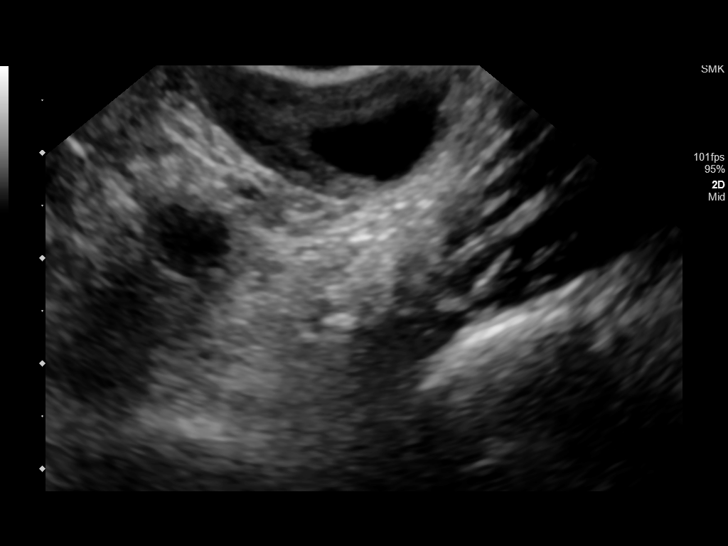
[im 114/114]
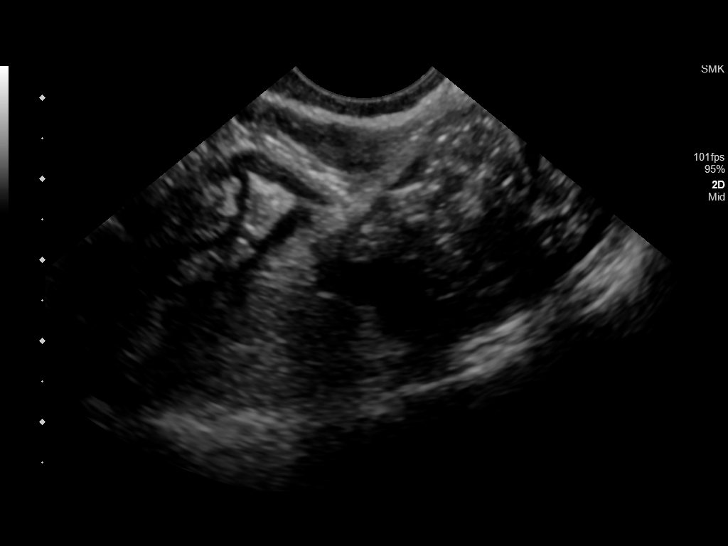

[13 of 25 positions shown; findings below may reference images not displayed]

FINDINGS: Uterus

Measurements: 7.0 x 3.8 x 4.2 cm. No fibroids or other mass
visualized.

Endometrium

Thickness: 4.8 mm.  No focal abnormality visualized.

Right ovary

Measurements: 3.3 x 1.8 x 1.8 cm. Normal appearance/no adnexal mass.

Left ovary

Measurements: 2.8 x 1.6 x 1.9 cm. Normal appearance/no adnexal mass.

Other findings

Trace free fluid within the pelvis, likely physiologic.
IMPRESSION: 1. Endometrial stripe within normal limits measuring 4.8 mm. If
bleeding remains unresponsive to hormonal or medical therapy,
sonohysterogram should be considered for focal lesion work-up. (Ref:
Radiological Reasoning: Algorithmic Workup of Abnormal Vaginal
Bleeding with Endovaginal Sonography and Sonohysterography. AJR
[id number]; 191:S68-73).
2. Otherwise unremarkable and normal pelvic ultrasound.
